# Patient Record
Sex: Female | Born: 1990 | Race: White | Hispanic: Yes | Marital: Married | Smoking: Never smoker
Health system: Western US, Academic
[De-identification: ages and names within clinical notes are randomized; demographics above are authoritative.]

---

## 2017-03-06 ENCOUNTER — Ambulatory Visit (INDEPENDENT_AMBULATORY_CARE_PROVIDER_SITE_OTHER): Payer: TRICARE Prime—HMO | Admitting: Family Medicine

## 2017-03-06 ENCOUNTER — Other Ambulatory Visit: Payer: TRICARE Prime—HMO | Attending: Family Medicine

## 2017-03-06 VITALS — BP 112/72 | HR 89 | Temp 98.3°F | Resp 20 | Ht 66.0 in | Wt 174.2 lb

## 2017-03-06 DIAGNOSIS — Z8639 Personal history of other endocrine, nutritional and metabolic disease: Secondary | ICD-10-CM | POA: Insufficient documentation

## 2017-03-06 DIAGNOSIS — Z3402 Encounter for supervision of normal first pregnancy, second trimester: Secondary | ICD-10-CM

## 2017-03-06 DIAGNOSIS — R5381 Other malaise: Secondary | ICD-10-CM | POA: Insufficient documentation

## 2017-03-06 DIAGNOSIS — Z3A14 14 weeks gestation of pregnancy: Principal | ICD-10-CM

## 2017-03-06 LAB — CBC WITH DIFF, BLOOD
ANC-Automated: 6.8 10*3/uL (ref 1.6–7.0)
Abs Eosinophils: 0.1 10*3/uL (ref 0.1–0.5)
Abs Lymphs: 1.7 10*3/uL (ref 0.8–3.1)
Abs Monos: 0.7 10*3/uL (ref 0.2–0.8)
Eosinophils: 1 %
Hct: 38.1 % (ref 34.0–45.0)
Hgb: 13.4 gm/dL (ref 11.2–15.7)
Lymphocytes: 18 %
MCH: 29.5 pg (ref 26.0–32.0)
MCHC: 35.2 g/dL (ref 32.0–36.0)
MCV: 83.9 um3 (ref 79.0–95.0)
MPV: 12.9 fL — ABNORMAL HIGH (ref 9.4–12.4)
Monocytes: 8 %
Plt Count: 206 10*3/uL (ref 140–370)
RBC: 4.54 10*6/uL (ref 3.90–5.20)
RDW: 13.2 % (ref 12.0–14.0)
Segs: 72 %
WBC: 9.4 10*3/uL (ref 4.0–10.0)

## 2017-03-06 LAB — GLUCOSE, BLOOD: Glucose: 71 mg/dL (ref 70–99)

## 2017-03-06 LAB — HIV 1/2 ANTIBODY & P24 ANTIGEN ASSAY, BLOOD: HIV 1/2 Antibody & P24 Antigen Assay: NONREACTIVE

## 2017-03-06 LAB — ABO/RH: ABO/RH: A POS

## 2017-03-06 LAB — GLYCOSYLATED HGB(A1C), BLOOD: Glyco Hgb (A1C): 5.1 % (ref 4.8–5.8)

## 2017-03-06 LAB — ANTIBODY SCREEN (INDIRECT COOMBS): Antibody Screen: NEGATIVE

## 2017-03-06 LAB — HEPATITIS B SURFACE AG, BLOOD: HBsAg: NONREACTIVE

## 2017-03-06 MED ORDER — PRENATAL VITAMIN PO
ORAL | Status: DC
Start: ? — End: 2018-05-06

## 2017-03-06 NOTE — Patient Instructions (Signed)
1. [redacted] weeks gestation of pregnancy  - Maternal Expanded Screen Test - 2nd Trimester Yellow serum separator tube; Future    2. Other malaise   - HIV 1/2 Antibody & P24 Antigen Assay Yellow serum separator tube; Future  - Glucose, Blood Green Plasma Separator Tube; Future    3. Personal history of other endocrine, nutritional and metabolic disease   - Glycosylated Hgb(A1C), Blood Lavender; Future    4. Encounter for supervision of normal first pregnancy in second trimester  Please have labwork done today   - Quantiferon-TB, Blood Quantiferon Tube Set; Future  - ABO/RH - See Instructions; Future  - Antibody Screen (Indirect Coombs) Lavender; Future  - CBC w/ Diff Lavender; Future  - Hepatitis B Surface Ag, Blood Yellow serum separator tube; Future  - Syphilis Screen, Blood Yellow serum separator tube; Future  - HIV 1/2 Antibody & P24 Antigen Assay Yellow serum separator tube; Future  - Rubella IgG Antibody, Blood Yellow serum separator tube; Future  - Varicella IgG Antibody, Blood Yellow serum separator tube; Future  - Urinalysis; Future  - Cystic Fibrosis, 39 Mutations, Blood Lavender; Future  - Glycosylated Hgb(A1C), Blood Lavender; Future  - Glucose, Blood Green Plasma Separator Tube; Future  - US 2nd/3rd Trimester OB>14 Weeks Anatomy Scan Low Risk; Future

## 2017-03-06 NOTE — Interdisciplinary (Signed)
Blood drawn from right hand with 23 gauge needle. 10 tubes taken.   Patient identity authenticated by Elvira Polanco.

## 2017-03-06 NOTE — Progress Notes (Signed)
Est care  Referral to OB [redacted] weeks pregnant

## 2017-03-06 NOTE — Progress Notes (Signed)
[facility]: Office Visit    SUBJECTIVE:  Belinda Watson is a 26 year old female with chief complaint of: New Patient and Initial Prenatal Visit    26yo G1P0 presenting to establish care for pregnancy. She is interested in seeing an obstetrical provider that will follow her through her pregnancy. Went yesterday for a "hi mom" scan in [place] and was told the gender of the baby. The heart beat was 150 during the scan and the baby was moving. There is no formal report for the scan.     Unsure of her weight prior to pregnancy but thinks she was 170lbs.     No symptoms. Has been having nausea all the time. Some sharp pain on her side.     LMP was 11/28/16   EDD 09/04/2017 based on sure LMP     Preventive Screenings/HM:     ROS: per HPI     OBJECTIVE:  The patient is a WDWN female in NAD, non-toxic appearance  BP 112/72 (BP Location: Right arm, BP Patient Position: Sitting, BP cuff size: Regular)   Pulse 89   Temp 98.3 F (36.8 C) (Oral)   Resp 20   Ht 5\' 6"  (1.676 m)   Wt 79 kg (174 lb 2.6 oz)   SpO2 98%   BMI 28.11 kg/m2    FHR: 168  Fundal height 15cm.     Assessment and Plan:     See Patient instructions below for detailed plan.   Lavona MoundGloria Cristina Klump is a 26 year old female with:    Patient Instructions   1. [redacted] weeks gestation of pregnancy  - Maternal Expanded Screen Test - 2nd Trimester Yellow serum separator tube; Future    2. Other malaise   - HIV 1/2 Antibody & P24 Antigen Assay Yellow serum separator tube; Future  - Glucose, Blood Green Plasma Separator Tube; Future    3. Personal history of other endocrine, nutritional and metabolic disease   - Glycosylated Hgb(A1C), Blood Lavender; Future    4. Encounter for supervision of normal first pregnancy in second trimester  Please have labwork done today   - Quantiferon-TB, Blood Quantiferon Tube Set; Future  - ABO/RH - See Instructions; Future  - Antibody Screen (Indirect Coombs) Lavender; Future  - CBC w/ Diff Lavender; Future  - Hepatitis B  Surface Ag, Blood Yellow serum separator tube; Future  - Syphilis Screen, Blood Yellow serum separator tube; Future  - HIV 1/2 Antibody & P24 Antigen Assay Yellow serum separator tube; Future  - Rubella IgG Antibody, Blood Yellow serum separator tube; Future  - Varicella IgG Antibody, Blood Yellow serum separator tube; Future  - Urinalysis; Future  - Cystic Fibrosis, 39 Mutations, Blood Lavender; Future  - Glycosylated Hgb(A1C), Blood Lavender; Future  - Glucose, Blood Green Plasma Separator Tube; Future  - US 2nd/3rd Trimester OB>14 Weeks Anatomy Scan Low Risk; Future          Return in 4 weeks (on 04/03/2017) for OB visit - schedule with Dr. Billee Cashingelebi to establish for obstetrical care in the next  1 week .    No past medical history on file.  No current outpatient prescriptions on file prior to visit.     No current facility-administered medications on file prior to visit.      Allergies as of 03/06/2017    (No Known Allergies)     No family history on file.  History   Smoking Status    Not on file  Smokeless Tobacco    Not on file       There is no immunization history on file for this patient.      Patient Instruction:   Medications reviewed with patient and medication list reconciled.   Over the counter medications, herbal therapies and supplements reviewed.   Patient's understanding and response to medications assessed.   Barriers to medications assessed and addressed.   Risks, benefits, alternatives to medications reviewed.   Barriers to Learning assessed: none. Patient verbalizes understanding of teaching and instructions.    Nolon Bussingeepa Paizley Ramella MD  Division of Family Medicine  [place] [facility]

## 2017-03-07 ENCOUNTER — Encounter (INDEPENDENT_AMBULATORY_CARE_PROVIDER_SITE_OTHER): Payer: Self-pay | Admitting: Family Medicine

## 2017-03-07 DIAGNOSIS — R8290 Unspecified abnormal findings in urine: Principal | ICD-10-CM

## 2017-03-07 LAB — SYPHILIS EIA SCREEN, BLOOD: Syphilis EIA Screen: NEGATIVE

## 2017-03-07 LAB — URINALYSIS
Bilirubin: NEGATIVE
Blood: NEGATIVE
Glucose: NEGATIVE
Ketones: NEGATIVE
Leuk Esterase: NEGATIVE
Nitrite: NEGATIVE
Specific Gravity: 1.024 (ref 1.002–1.030)
Urobilinogen: NEGATIVE
pH: 6 (ref 5.0–8.0)

## 2017-03-07 LAB — RUBELLA IGG ANTIBODY, BLOOD: Rubella IGG Ab (EIA): POSITIVE

## 2017-03-08 ENCOUNTER — Telehealth (INDEPENDENT_AMBULATORY_CARE_PROVIDER_SITE_OTHER): Payer: Self-pay | Admitting: Family Medicine

## 2017-03-08 LAB — VARICELLA IGG ANTIBODY (ELISA), BLOOD: Varicella Zoster IgG Ab: NEGATIVE mL — AB

## 2017-03-08 LAB — QUANTIFERON-TB, BLOOD
Quantiferon TB: NEGATIVE
TB Antigen - Nil: -0.007 [IU]/mL

## 2017-03-08 NOTE — Telephone Encounter (Signed)
Pt called to follow up on her request for a callback to discuss her lab result.     Patient states she did not understand the result in MyChart.     Please advise.

## 2017-03-08 NOTE — Telephone Encounter (Signed)
Pt is calling to get lab results.    Pt stated she did not understand the results that were to sent to her via mychart.     Please assist.

## 2017-03-09 LAB — CYSTIC FIBROSIS, 39 MUTATIONS, BLOOD

## 2017-03-09 NOTE — Telephone Encounter (Signed)
Sent mychart to follow up.

## 2017-03-12 ENCOUNTER — Encounter (HOSPITAL_BASED_OUTPATIENT_CLINIC_OR_DEPARTMENT_OTHER): Payer: Self-pay | Admitting: Gynecology

## 2017-03-14 ENCOUNTER — Encounter (INDEPENDENT_AMBULATORY_CARE_PROVIDER_SITE_OTHER): Payer: Self-pay | Admitting: Family Medicine

## 2017-03-14 ENCOUNTER — Ambulatory Visit (INDEPENDENT_AMBULATORY_CARE_PROVIDER_SITE_OTHER): Payer: TRICARE Prime—HMO | Admitting: Family Medicine

## 2017-03-14 ENCOUNTER — Other Ambulatory Visit: Payer: TRICARE Prime—HMO | Attending: Family Medicine

## 2017-03-14 VITALS — BP 98/61 | HR 84 | Temp 97.0°F | Resp 17 | Ht 66.0 in | Wt 199.5 lb

## 2017-03-14 DIAGNOSIS — R8290 Unspecified abnormal findings in urine: Secondary | ICD-10-CM | POA: Insufficient documentation

## 2017-03-14 DIAGNOSIS — Z3402 Encounter for supervision of normal first pregnancy, second trimester: Principal | ICD-10-CM

## 2017-03-14 LAB — URINALYSIS WITH CULTURE REFLEX, WHEN INDICATED
Bilirubin: NEGATIVE
Blood: NEGATIVE
Glucose: NEGATIVE
Ketones: NEGATIVE
Leuk Esterase: NEGATIVE
Nitrite: NEGATIVE
Protein: NEGATIVE
Specific Gravity: 1.013 (ref 1.002–1.030)
Urobilinogen: NEGATIVE
pH: 7 (ref 5.0–8.0)

## 2017-03-14 NOTE — Progress Notes (Signed)
Patient is here for amenorrhea and to confirm pregnancy.   Her LMP was 11/28/16. Her cycles were regular.     First official OB/GYN visit today. This is her first pregnancy. She states that she feels nauseous with no vomiting currently, but did have nausea with vomiting last week. Has troubles drinking water due to nausea. Has a history of bladder infections. History of tobacco use, but has not consumed during pregnancy.    Prior to pregnancy, patient reports being 180 lbs.    Patient's father side has history with arthritis and patient's father has high cholesterol. Patient was premature due to tangled umbilical cord. Father of baby has history of cancer. Maternal mother and grandmother has history of diabetes.       Allergies: Pollen      Past medical history:   Diabetes Y ___ N _X_   Heart Disease Y ___ N _X__   Auto-immune disorder Y ___ N _X__   Kidney Disease-UTI Y ___ N _X__   Neurologic/Epilepsy Y ___ N __X_   Psychiatric Y ___ N _X_   Depression/PP Depression Y ___ N _X__   Hepatitis/Liver Disease Y ___ N _X__   Varicosities/Phlebitis Y ___ N __X_   Thyroid Dysfunction Y ___ N _X__   Trauma/Violence Y ___ N _X__   History of Blood Transfusion Y ___ N _X__   Tobacco Y ___ N _X__   Alcohol Y ___ N __X_   Illicit/Recreational Drugs Y ___ N __X_   D (Rh) Sensitized Y ___ N _X__   Pulmonary (TB, Asthma) Y ___ N __X__  Seasonal Allergies Y ___ N _X__   Drug/Latex Allergies Y ___ N _X__   Breast Surgery Y ___ N __X_   Gyn Surgery Y ___ N _X__   Operations/Hopsitalizations Y ___ N __X_  Anesthetic Complications Y ___ N _X__   History of Abnormal PAP Y ___ N __X_   Uterine Anomalies Y ___ N _X__   Infertility Y ___ N _X__       Genetic Screening (includes patient, baby's father or anyone in either family with):   Patient's age 78 years or older as of the estimated date of delivery NO   Thalassemia (IItalian, [place], Mediteranian or Asian background) NO   Congenital heart defect NO   Down's Syndrome NO   Tay-sachs  (Ashkenazi Jewish, [place], [place]) NO   Canavan Disease (Ashkenazi Jewish) NO   Familial Dysautonomia (Ashkenazi Jewish) NO   Sickle-cell disease or trait NO   Hemophilia or other blood disorders NO   Muscular Dystrophy NO  Cystic Fibrosis NO   Huntingtons Chorea NO   Mental Retardation/Autism NO  Other Inherited genetic or chromosomal disorders NO   Patient or baby's father had a previous child with birth defect? NO   Recurrent pregnancy lost or still birth? NO   Medications including OTC since LMP:  none    Infection History   Live with someone with TB or exposed to TB NO   Patient or partner has history of genital herpes NO   Rash or viral illness since LMP NO   Hepititis B NO   Hepititis C NO   History of STD NO  Gonorrihea NO   Chlamydia NO   HPV NO   HIV NO   Syphillis NO    Labs:  Recent Results (from the past 1008 hour(s))   Urinalysis    Collection Time: 03/06/17  3:30 PM   Result Value Ref Range  Type Clean catch     Color Yellow Yellow    Appearance Hazy Clear    Specific Gravity 1.024 1.002 - 1.030    pH 6.0 5.0 - 8.0    Protein 1+ (A) Negative    Glucose Negative Negative    Ketones Negative Negative    Bilirubin Negative Negative    Blood Negative Negative    Urobilinogen Negative Negative    Nitrite Negative Negative    Leuk Esterase Negative Negative    WBC 0-2 0-2/HPF    RBC 3-5 (A) 0-2/HPF    Bacteria Few (A) None-Rare/HPF    Squam. Epithelial Cell 0-5(RARE) <6-10(FEW)    Mucus Rare None-Rare/HPF    CAOX Crystal Rare None-Rare/HPF   ABO/RH - See Instructions    Collection Time: 03/06/17  3:48 PM   Result Value Ref Range    ABO/RH A POS    Antibody Screen (Indirect Coombs) Lavender    Collection Time: 03/06/17  3:48 PM   Result Value Ref Range    Antibody Screen NEG    Quantiferon-TB, Blood Quantiferon Tube Set    Collection Time: 03/06/17  3:48 PM   Result Value Ref Range    Quantiferon TB Negative See Comment    TB Antigen - Nil -0.007 [IU]/mL   CBC w/ Diff Lavender    Collection Time:  03/06/17  3:48 PM   Result Value Ref Range    WBC 9.4 4.0 - 10.0 1000/mm3    RBC 4.54 3.90 - 5.20 mill/mm3    Hgb 13.4 11.2 - 15.7 gm/dL    Hct 38.1 34.0 - 45.0 %    MCV 83.9 79.0 - 95.0 um3    MCH 29.5 26.0 - 32.0 pgm    MCHC 35.2 32.0 - 36.0 g/dL    RDW 13.2 12.0 - 14.0 %    MPV 12.9 (H) 9.4 - 12.4 fL    Plt Count 206 140 - 370 1000/mm3    Segs 72 %    Lymphocytes 18 %    Monocytes 8 %    Eosinophils 1 %    ANC-Automated 6.8 1.6 - 7.0 1000/mm3    Abs Lymphs 1.7 0.8 - 3.1 1000/mm3    Abs Monos 0.7 0.2 - 0.8 1000/mm3    Abs Eosinophils 0.1 <0.1 - 0.5 1000/mm3    Diff Type Automated    Hepatitis B Surface Ag, Blood Yellow serum separator tube    Collection Time: 03/06/17  3:48 PM   Result Value Ref Range    HBsAg Non Reactive    Syphilis Screen, Blood Yellow serum separator tube    Collection Time: 03/06/17  3:48 PM   Result Value Ref Range    Syphilis EIA Screen Negative See Comment   HIV 1/2 Antibody & P24 Antigen Assay Yellow serum separator tube    Collection Time: 03/06/17  3:48 PM   Result Value Ref Range    HIV 1/2 Antibody & P24 Antigen Assay Non Reactive    Rubella IgG Antibody, Blood Yellow serum separator tube    Collection Time: 03/06/17  3:48 PM   Result Value Ref Range    Rubella IGG Ab (EIA) POSITIVE See Comment   Varicella IgG Antibody, Blood Yellow serum separator tube    Collection Time: 03/06/17  3:48 PM   Result Value Ref Range    Varicella Zoster IgG Ab Negative (A) See Comment mL   Cystic Fibrosis, 39 Mutations, Blood Lavender    Collection Time: 03/06/17  3:48 PM  Result Value Ref Range    CF Source Peripheral Blood     CF Allele 1 Wild-type     CF Allele 2 Wild-type     CF Interp Comments       Negative for the mutations analyzed. With no family history, the residual   carrier risk for an individual after a negative test is:    Ethnic Group       Carrier Risk    Detection Rate    Risk After a Neg. Test  Ashkenazi Jewish   1 in 9               94.0%              1 in 103    Caucasian           1 in 27               90.5%              1 in 294  (Non Hispanic)    African American   1 in 89               67.5%              1 in 190    Hispanic American  1 in 35               73.8%              1 in 38    Asian American     1 in 90               48.9%              1 in 176        CF Report       Background  Cystic fibrosis (CF) is an autosomal recessive disorder resulting from   mutations within the cystic fibrosis transmembrane conductance regulator   (CFTR) gene. Manifestations of the disease include chronic obstructive lung   disease and pancreatic enzyme insufficiency. Greater than 1,500 causative   variants have been described within the CFTR gene. The most common variant,   F508del, accounts for approximately 67% of the mutations worldwide and   approximately 75% in the [place] population. Most of the   remaining mutations are rare, with some having a higher prevalence in certain   ethnic groups. Variants detected by this assay include the 23 mutations   currently recommended by the [place] and [employer] of [employer]stetricians and Gynecologists (ACMG/ACOG) as well as 16 other   mutations as listed below:    G85E (c.254G>A)  R117H (c.350G>A)  R334W (c.1000C>T)  R347P (c.1040G>C)  R347H (c.1040G>A)  394delTT (c.262_263delTT)   A455E (c.1364C>A)  I507del (c.1519_1521delATC)  F508del (c.1521_1523delCTT)  V520F (c.1558G>T)  G542X (c.1624G>T)  S549N (c.1646G>A)  S549R (c.1645A>C or 1647T>G)  G551D (c.1652G>A)  R553X (c.1657C>T)  R560T (c.1679G>C)  621+1G>T (c.489+1G>T)  711+1G>T (c.579+1G>T)  1078delT (c.948delT)  R1162X (c.3484C>T)  W1282X (c.3846G>A)  N1303K (c.3909C>G)  1717-1G>A (c.1585-1G>A)  1898+1G>A (c.1766+1G>A)  2183AA>G (c.2051_2052delAAinsG)  2184delA (c.2052delA)  2789+5G>A (c.2657+5G>A)  3120+1G>A (c.2988+1G>A)  3659delC (c.3528delC)  3849+10kbC>T (c.3717+12191C>T)  3876delA (c.3744delA)  3905insT (c.3773_3774insT)  Y122X (c.366T>A)  A559T  (c.1675G>A)  1898+5G>T (c.1766+5G>T)  2307insA (c.2175_2176insA)  Y1092X (c.3276C>A, c.3276C>G)  M1101K (c.3302T>A)  S1255X (c.3764C>A)          For specimens positive for R117H, the  IVS-8/poly T (c.1210-12T[5_9]is   analyzed. Reflex testing for the I506V, I507V and F508C polymorphisms is   performed when unexpected homozygosity is observed for either the delta F508 or   deltaI507 mutations.  The mutations tested are listed above according to the   legacy nomenclature; the standard nomenclature is listed in parentheses. Panel   mutations are reported according to the legacy nomenclature.    Methodology  The test is performed using the Luminex xTAG Cystic Fibrosis 39 Kit v2. The   assay incorporates multiplex polymerase chain reaction (PCR) and multiplex   allele specific primer extension (ASPE) with a proprietary universal tag   sorting system on the Luminex analyzer.    Limitations  The xTAG Cystic Fibrosis (CFTR) 39 kit v2 is a qualitative genotyping test   which provides information intended to be used for carrier testing in adults   of reproductive age, as an aid in newborn screening, and in confirmatory   diagnostic testing in newborns and children. Rare diagnostic errors can occur   due to primer or probe mutations. Only the 39 CFTR mutations listed above will   be interrogated.    Compliance  This test was adopted and its performance  characteristics determined by the [facility]. It has not been   cleared or approved by the Food and Drug Administration (FDA). FDA clearance   or approval is not required for clinical use. The result of this test should   be interpreted using all relevant clinical data and should not be used alone   for clinical diagnosis or patient management decisions.    References  1. Watson MS et al. (2004) Cystic fibrosis population carrier screening: 2004   revision of [employer] of Medical Genetics mutation panel. Gen in Med      6:397-91.  2. Lorel Monaco et al. (2006) Technical standards and guidelines for CFTR mutation   testing. [employer]CMG [employer].   3. Luminex Kit Package Insert, IVD xTAG Cystic Fibrosis 39 Kit v2,   [id number] Rev K, effective date: October 2015      Electronic Signature       Chauncy Lean, M.D., Ph.D., Medical Director, [facility]    Date/Time 03/09/2017 1:19 PM    Glycosylated Hgb(A1C), Blood Lavender    Collection Time: 03/06/17  3:48 PM   Result Value Ref Range    Glyco Hgb (A1C) 5.1 4.8 - 5.8 %   Glucose, Blood Green Plasma Separator Tube    Collection Time: 03/06/17  3:48 PM   Result Value Ref Range    Glucose 71 70 - 99 mg/dL         Objective:   03/14/17  0846   BP: 98/61   Pulse: 84   Resp: 17   Temp: 97 F (36.1 C)   SpO2: 99%       General Appearance: healthy and alert  Lungs: clear to auscultation; breath sounds normal; no respiratory distress  Heart: normal rate and rhythm and normal S1, S2, no murmurs  Abdomen: Fundal Height: 15.5 cm. FHR: 140  Ext: no edema    A/P: 26yo G1P0 at 29w1dby LMP    1. Encounter for supervision of normal first pregnancy in second trimester  - Urine Culture - See Instructions; Future  - Chlamydia/GC PCR, Urine Roche PCR Collection SystemTube; Future  - Spinal Muscular Atrophy (SMA) Copy Number Analysis, Blood Lavender; Future  - [place]3rd Tr[place] OB>14 Weeks Anatomy Scan Low  Risk; Future  - Family Medicine Clinic    2. Abnormal finding in urine   - Urine Culture - See Instructions; Future    Patient Instructions   Thank you for visiting [place] today. Here are some guidelines and notes from our conversations during your visit.    1. Make sure to stay hydrated. Avoid tea and coffee based drinks as they may cause more frequent urination. Continue to take your pre-natal supplements and make sure they have some iron and at least 161WRU folic acid.    2. Please get your labs done after this visit.    3. I have made a referral for [place] to try to make your  scheduling easier. You can go ahead and schedule your routine anatomy scan for your 18 week mark.    4. If you need to contact me, feel free to contact me through MyChart for non-urgent matters or call the clinic for urgent matters.    5. Expected weight gain during pregnancy is about 25 lbs and you are currently at a 20 lbs increase from your pre-pregnancy weight, so let's keep an eye on your weight.    Thank you,    Dr. Lady Deutscher        I am personally taking down the notes in the presence of Dr. Laurena Bering, MD. -- Gene He, 03/14/2017 8:59 AM    I, Lady Deutscher, personally performed the services in this documentation, as scribed by [facility] in my presence and I attest to the accuracy and completion of the documentation.  Laurena Bering, MD

## 2017-03-14 NOTE — Addendum Note (Signed)
Addended by: Sonia SideMEADOWS, ESPERANZA MEDINA on: 03/14/2017 10:11 AM     Modules accepted: Orders

## 2017-03-14 NOTE — Interdisciplinary (Signed)
Blood drawn from right arm with 23 gauge needle. 1 tubes taken.   Patient identity authenticated by Esperanza Medina Meadows.

## 2017-03-14 NOTE — Patient Instructions (Addendum)
Thank you for visiting us today. Here are some guidelines and notes from our conversations during your visit.    1. Make sure to stay hydrated. Avoid tea and coffee based drinks as they may cause more frequent urination. Continue to take your pre-natal supplements and make sure they have some iron and at least 400mcg folic acid.    2. Please get your labs done after this visit.    3. I have made a referral for [place] to try to make your scheduling easier. You can go ahead and schedule your routine anatomy scan for your 18 week mark.    4. If you need to contact me, feel free to contact me through MyChart for non-urgent matters or call the clinic for urgent matters.    5. Expected weight gain during pregnancy is about 25 lbs and you are currently at a 20 lbs increase from your pre-pregnancy weight, so let's keep an eye on your weight.    Thank you,    Dr. Delice LeschJulie Celebi

## 2017-03-15 ENCOUNTER — Encounter: Payer: Self-pay | Admitting: Hospital

## 2017-03-16 LAB — CHLAMYDIA/GONORRHEA PCR, URINE
Chlamydia trachomatis PCR, Urine: NOT DETECTED
Neisseria gonorrhoeae PCR, Urine: NOT DETECTED

## 2017-03-22 ENCOUNTER — Encounter (INDEPENDENT_AMBULATORY_CARE_PROVIDER_SITE_OTHER): Payer: Self-pay | Admitting: Family Medicine

## 2017-03-22 LAB — SPINAL MUSCULAR ATROPHY (SMA) COPY NUMBER ANALYSIS
SMA Copy Number, SMN1 Copies: 2
SMA Copy Number, SMN2 Copies: 0

## 2017-04-12 ENCOUNTER — Encounter (INDEPENDENT_AMBULATORY_CARE_PROVIDER_SITE_OTHER): Payer: TRICARE Prime—HMO | Admitting: Family Medicine

## 2017-06-06 ENCOUNTER — Encounter (INDEPENDENT_AMBULATORY_CARE_PROVIDER_SITE_OTHER): Payer: Self-pay | Admitting: Internal Medicine

## 2017-06-06 DIAGNOSIS — Z0000 Encounter for general adult medical examination without abnormal findings: Principal | ICD-10-CM

## 2017-12-03 ENCOUNTER — Encounter (HOSPITAL_COMMUNITY): Payer: Self-pay

## 2017-12-14 ENCOUNTER — Encounter (INDEPENDENT_AMBULATORY_CARE_PROVIDER_SITE_OTHER): Payer: TRICARE Prime—HMO | Admitting: Family Medicine

## 2018-01-31 ENCOUNTER — Other Ambulatory Visit: Payer: TRICARE Prime—HMO | Attending: Family Medicine | Admitting: Family Medicine

## 2018-01-31 VITALS — BP 106/74 | HR 83 | Temp 97.5°F | Resp 20 | Ht 66.0 in | Wt 206.0 lb

## 2018-01-31 DIAGNOSIS — N760 Acute vaginitis: Secondary | ICD-10-CM | POA: Insufficient documentation

## 2018-01-31 DIAGNOSIS — Z1389 Encounter for screening for other disorder: Secondary | ICD-10-CM

## 2018-01-31 DIAGNOSIS — Z723 Lack of physical exercise: Secondary | ICD-10-CM

## 2018-01-31 DIAGNOSIS — Z30011 Encounter for initial prescription of contraceptive pills: Secondary | ICD-10-CM

## 2018-01-31 DIAGNOSIS — Z309 Encounter for contraceptive management, unspecified: Secondary | ICD-10-CM

## 2018-01-31 DIAGNOSIS — Z1339 Encounter for screening examination for other mental health and behavioral disorders: Secondary | ICD-10-CM

## 2018-01-31 DIAGNOSIS — Z124 Encounter for screening for malignant neoplasm of cervix: Secondary | ICD-10-CM | POA: Insufficient documentation

## 2018-01-31 DIAGNOSIS — Z1331 Encounter for screening for depression: Secondary | ICD-10-CM

## 2018-01-31 LAB — VAGINOSIS SCREEN
Candida, Vaginal Screen: NEGATIVE
Gardnerella, Vaginal Screen: POSITIVE — AB
Trichomonas, Vaginal Screen: NEGATIVE

## 2018-01-31 MED ORDER — NORETHINDRONE (CONTRACEPTIVE) 0.35 MG OR TABS
1.0000 | ORAL_TABLET | Freq: Every day | ORAL | 11 refills | Status: DC
Start: 2018-01-31 — End: 2018-04-04

## 2018-01-31 NOTE — Patient Instructions (Addendum)
Start minipill for birth control-- use condoms as back-up for 48 hours     We will let you know when Mirena IUD gets approved.    Intrauterine Device Insertion  An intrauterine device (IUD) is a medical device that gets inserted into the uterus to prevent pregnancy. It is a small, T-shaped device that has one or two nylon strings hanging down from it. The strings hang out of the lower part of the uterus (cervix) to allow for future IUD removal. There are two types of IUDs available:   Copper IUD. This type of IUD has copper wire wrapped around it. Copper makes the uterus and fallopian tubes produce a fluid that kills sperm. A copper IUD may last up to 10 years.   Hormone IUD. This type of IUD is made of plastic and contains the hormone progestin (synthetic progesterone). The hormone thickens mucus in the cervix and prevents sperm from entering the uterus. It also thins the uterine lining to prevent implantation of a fertilized egg. The hormone can weaken or kill the sperm that get into the uterus. A hormone IUD may last 3-5 years.    Tell a health care provider about:   Any allergies you have.   All medicines you are taking, including vitamins, herbs, eye drops, creams, and over-the-counter medicines.   Any problems you or family members have had with anesthetic medicines.   Any blood disorders you have.   Any surgeries you have had.   Any medical conditions you have, including any STIs (sexually transmitted infections) you may have.   Whether you are pregnant or may be pregnant.  What are the risks?  Generally, this is a safe procedure. However, problems may occur, including:   Infection.   Bleeding.   Allergic reactions to medicines.   Accidental puncture (perforation) of the uterus, or damage to other structures or organs.   Accidental placement of the IUD either in the muscle layer of the uterus (myometrium) or outside the uterus.   The IUD falling out of the uterus (expulsion). This is more  common among women who have recently had a child.   Pregnancy that happens in the fallopian tube (ectopic pregnancy).   Infection of the uterus and fallopian tubes (pelvic inflammatory disease).    What happens before the procedure?   Schedule the IUD insertion for when you will have your menstrual period or right after, to make sure you are not pregnant. Placement of the IUD is better tolerated shortly after a menstrual cycle.   Follow instructions from your health care provider about eating or drinking restrictions.   Ask your health care provider about changing or stopping your regular medicines. This is especially important if you are taking diabetes medicines or blood thinners.   You may get a pain reliever to take before the procedure.   You may have tests for:  ? Pregnancy. A pregnancy test involves having a urine sample taken.  ? STIs. Placing an IUD in someone who has an STI can make the infection worse.  ? Cervical cancer. You may have a Pap test to check for this type of cancer. This means collecting cells from your cervix to be examined under a microscope.   You may have a physical exam to determine the size and position of your uterus.  The procedure may vary among health care providers and hospitals.  What happens during the procedure?   A tool (speculum) will be placed in your vagina and widened so  that your health care provider can see your cervix.   Medicine may be applied to your cervix to help lower your risk of infection (antiseptic medicine).   You may be given an anesthetic medicine to numb each side of your cervix (intracervical block or paracervical block). This medicine is usually given by an injection into the cervix.   A tool (uterine sound) will be inserted into your uterus to determine the length of your uterus and the direction that your uterus may be tilted.   A slim instrument or tube (IUD inserter) that holds the IUD will be inserted into your vagina, through your  cervical canal, and into your uterus.   The IUD will be placed in the uterus, and the IUD inserter will be removed.   The strings that are attached to the IUD will be trimmed so that they lie just below the cervix.  The procedure may vary among health care providers and hospitals.  What happens after the procedure?   You may have bleeding after the procedure. This is normal. It varies from light bleeding (spotting) for a few days to menstrual-like bleeding.   You may have cramping and pain.   You may feel dizzy or light-headed.   You may have lower back pain.  Summary   An intrauterine device (IUD) is a small, T-shaped device that has one or two nylon strings hanging down from it.   Two types of IUDs are available. You may have a copper IUD or a hormone IUD.   Schedule the IUD insertion for when you will have your menstrual period or right after, to make sure you are not pregnant. Placement of the IUD is better tolerated shortly after a menstrual cycle.   You may have bleeding after the procedure. This is normal. It varies from light spotting for a few days to menstrual-like bleeding.  This information is not intended to replace advice given to you by your health care provider. Make sure you discuss any questions you have with your health care provider.  Document Released: 03/22/2011 Document Revised: 06/14/2016 Document Reviewed: 06/14/2016  Elsevier Interactive Patient Education  2017 [employer] Inc[employer]    Eat something light before your appointment.  Don't urinate just before arrival, because you will give a urine sample for a pregnancy test.  To manage any pain, take an over the counter medication for cramping like:  Ibuprofen (Advil) Take 3-4 of 200 mg tablets 30 minutes prior to the appointment  Naproxen (Aleve) Take 2 of the 220 mg tablets 30 minutes prior to the appointment  1000 mg of Tylenol (acetaminophen).    *If you are allergic to ibuprofen, Tylenol or naproxen or should be avoiding these  kinds of medications, consult with a provider.    If you have any questions or concerns, please feel free to give us a call at [phone].

## 2018-01-31 NOTE — Progress Notes (Signed)
[facility] Progress Note    CC:   Chief Complaint   Patient presents with    Gyn Exam         Subjective: Belinda Watson is a 27 year old female who is here for CC above.      Had term SVD 5 months ago, went well, did have a lac. No issues with intercourse  Never had 6wk postpartum visit, just 2wk after delivery  Wondering about being cleared for exercise, doing some higher intensity walking  About 40 lb wt gain in pregnancy  Breastfeeding going well  Started back with menses in February, monthly  Using condoms consistently, no other hormonal methods, worried bout weight gain  Would like to get pregnant in about 42yr  Mood has been OK overall, does sometimes get emotional but seems to correlate with menses  Needs pap, no hx of abnormal, last pap at least a couple years ago.  Did end up getting varicella vaccine in hospital postpartum. She believes she got Tdap as well  Notes fishy vaginal odor with some discharge. Also occasional lower pelvic pains. No concern for STIs, just one female partner    Wt Readings from Last 5 Encounters:   01/31/18 93.4 kg (206 lb)   03/14/17 90.5 kg (199 lb 8.3 oz)   03/06/17 79 kg (174 lb 2.6 oz)         ROS: as per HPI    PMH reviewed/updated.    There is no problem list on file for this patient.      Medications reviewed with patient and medication list reconciled.   Patient's understanding and response to medications assessed.     Outpatient Medications Prior to Visit   Medication Sig Dispense Refill    Prenatal Vit-Fe Fumarate-FA (PRENATAL VITAMIN PO)        No facility-administered medications prior to visit.          There is no immunization history on file for this patient.  No Known Allergies    Objective:  Vitals:    01/31/18 1024   BP: 106/74   BP Location: Right arm   BP Patient Position: Sitting   BP cuff size: Large   Pulse: 83   Resp: 20   Temp: 97.5 F (36.4 C)   TempSrc: Oral   SpO2: 99%   Weight: 93.4 kg (206 lb)   Height: 5\' 6"  (1.676 m)      Estimated body mass index is 32.2 kg/m as calculated from the following:    Height as of 03/14/17: 5\' 6"  (1.676 m).    Weight as of 03/14/17: 90.5 kg (199 lb 8.3 oz).      General Appearance: healthy, alert, no distress, pleasant affect, cooperative.  Eyes:  conjunctivae and corneas clear. PERRL, EOM's intact.   Ears:  normal TMs and canal.  Mouth: OP clear, dentition in good repair.  Neck:  Neck supple. No adenopathy, thyroid symmetric, normal size.  Heart:  normal rate and regular rhythm, no murmurs, clicks, or gallops.  Lungs: clear to auscultation, respirations unlabored, no chest deformities noted.  Breast:  inspection negative. No nipple discharge, bleeding or masses.  Pelvic:  External genitalia and vagina normal, thin white vaginal discharge present, cervix with Nabothian cysts but otherwise clear, normal sized uterus, adnexae negative.      Assessment/Plan: Belinda Watson is a 27 year old female with the following issues addressed:    1. Cervical cancer screening  - Cytopath Gyn (Pap  Smear)    2. Vaginosis  Suspect BV, management pending results  - Vaginosis Screen BD Affirm Collection System    3. Encounter for contraceptive management, unspecified type  Discussed various methods, Pt interested in IUD. Will use minipill in the meantime - counseled on need for barrier method for first 48h after starting, also the need to take at the same time every day.  - DME Misc Order: Mirena IUD  - norethindrone (ORTHO MICRONOR) 0.35 MG tablet; Take 1 tablet by mouth daily.  Dispense: 28 tablet; Refill: 4  - [facility]    4. Screened negative for drug use    5. Inadequate exercise  Cleared for physical activity, start low-impact and advance as tolerated.    6. Screened negative for alcohol use    7. Screened negative for depression          Patient Instructions   Start minipill for birth control-- use condoms as back-up for 48 hours     We will let you know when Mirena IUD gets  approved.    Intrauterine Device Insertion  An intrauterine device (IUD) is a medical device that gets inserted into the uterus to prevent pregnancy. It is a small, T-shaped device that has one or two nylon strings hanging down from it. The strings hang out of the lower part of the uterus (cervix) to allow for future IUD removal. There are two types of IUDs available:   Copper IUD. This type of IUD has copper wire wrapped around it. Copper makes the uterus and fallopian tubes produce a fluid that kills sperm. A copper IUD may last up to 10 years.   Hormone IUD. This type of IUD is made of plastic and contains the hormone progestin (synthetic progesterone). The hormone thickens mucus in the cervix and prevents sperm from entering the uterus. It also thins the uterine lining to prevent implantation of a fertilized egg. The hormone can weaken or kill the sperm that get into the uterus. A hormone IUD may last 3-5 years.    Tell a health care provider about:   Any allergies you have.   All medicines you are taking, including vitamins, herbs, eye drops, creams, and over-the-counter medicines.   Any problems you or family members have had with anesthetic medicines.   Any blood disorders you have.   Any surgeries you have had.   Any medical conditions you have, including any STIs (sexually transmitted infections) you may have.   Whether you are pregnant or may be pregnant.  What are the risks?  Generally, this is a safe procedure. However, problems may occur, including:   Infection.   Bleeding.   Allergic reactions to medicines.   Accidental puncture (perforation) of the uterus, or damage to other structures or organs.   Accidental placement of the IUD either in the muscle layer of the uterus (myometrium) or outside the uterus.   The IUD falling out of the uterus (expulsion). This is more common among women who have recently had a child.   Pregnancy that happens in the fallopian tube (ectopic  pregnancy).   Infection of the uterus and fallopian tubes (pelvic inflammatory disease).    What happens before the procedure?   Schedule the IUD insertion for when you will have your menstrual period or right after, to make sure you are not pregnant. Placement of the IUD is better tolerated shortly after a menstrual cycle.   Follow instructions from your health care provider about eating or  drinking restrictions.   Ask your health care provider about changing or stopping your regular medicines. This is especially important if you are taking diabetes medicines or blood thinners.   You may get a pain reliever to take before the procedure.   You may have tests for:  ? Pregnancy. A pregnancy test involves having a urine sample taken.  ? STIs. Placing an IUD in someone who has an STI can make the infection worse.  ? Cervical cancer. You may have a Pap test to check for this type of cancer. This means collecting cells from your cervix to be examined under a microscope.   You may have a physical exam to determine the size and position of your uterus.  The procedure may vary among health care providers and hospitals.  What happens during the procedure?   A tool (speculum) will be placed in your vagina and widened so that your health care provider can see your cervix.   Medicine may be applied to your cervix to help lower your risk of infection (antiseptic medicine).   You may be given an anesthetic medicine to numb each side of your cervix (intracervical block or paracervical block). This medicine is usually given by an injection into the cervix.   A tool (uterine sound) will be inserted into your uterus to determine the length of your uterus and the direction that your uterus may be tilted.   A slim instrument or tube (IUD inserter) that holds the IUD will be inserted into your vagina, through your cervical canal, and into your uterus.   The IUD will be placed in the uterus, and the IUD inserter will be  removed.   The strings that are attached to the IUD will be trimmed so that they lie just below the cervix.  The procedure may vary among health care providers and hospitals.  What happens after the procedure?   You may have bleeding after the procedure. This is normal. It varies from light bleeding (spotting) for a few days to menstrual-like bleeding.   You may have cramping and pain.   You may feel dizzy or light-headed.   You may have lower back pain.  Summary   An intrauterine device (IUD) is a small, T-shaped device that has one or two nylon strings hanging down from it.   Two types of IUDs are available. You may have a copper IUD or a hormone IUD.   Schedule the IUD insertion for when you will have your menstrual period or right after, to make sure you are not pregnant. Placement of the IUD is better tolerated shortly after a menstrual cycle.   You may have bleeding after the procedure. This is normal. It varies from light spotting for a few days to menstrual-like bleeding.  This information is not intended to replace advice given to you by your health care provider. Make sure you discuss any questions you have with your health care provider.  Document Released: 03/22/2011 Document Revised: 06/14/2016 Document Reviewed: 06/14/2016  Elsevier Interactive Patient Education  2017 [employer] Inc.    Eat something light before your appointment.  Don't urinate just before arrival, because you will give a urine sample for a pregnancy test.  To manage any pain, take an over the counter medication for cramping like:  Ibuprofen (Advil) Take 3-4 of 200 mg tablets 30 minutes prior to the appointment  Naproxen (Aleve) Take 2 of the 220 mg tablets 30 minutes prior to the appointment  1000 mg  of Tylenol (acetaminophen).    *If you are allergic to ibuprofen, Tylenol or naproxen or should be avoiding these kinds of medications, consult with a provider.    If you have any questions or concerns, please feel free to  give [place] a call at [phone].      Barriers to Learning assessed: none. Patient verbalizes understanding of teaching and instructions.    Return for IUD insertion.    Charleston Poot, MD

## 2018-02-04 ENCOUNTER — Encounter (INDEPENDENT_AMBULATORY_CARE_PROVIDER_SITE_OTHER): Payer: Self-pay

## 2018-02-04 ENCOUNTER — Encounter (INDEPENDENT_AMBULATORY_CARE_PROVIDER_SITE_OTHER): Payer: Self-pay | Admitting: Family Medicine

## 2018-02-04 DIAGNOSIS — B9689 Other specified bacterial agents as the cause of diseases classified elsewhere: Secondary | ICD-10-CM

## 2018-02-04 MED ORDER — METRONIDAZOLE 0.75 % VA GEL
1.0000 | Freq: Every evening | VAGINAL | 0 refills | Status: DC
Start: 2018-02-04 — End: 2018-05-06

## 2018-02-09 ENCOUNTER — Encounter (INDEPENDENT_AMBULATORY_CARE_PROVIDER_SITE_OTHER): Payer: Self-pay | Admitting: Family Medicine

## 2018-02-25 ENCOUNTER — Telehealth (INDEPENDENT_AMBULATORY_CARE_PROVIDER_SITE_OTHER): Payer: Self-pay | Admitting: Family Medicine

## 2018-02-25 NOTE — Telephone Encounter (Signed)
Patient has been approved for Mirena Interuterine Device, Please review, order device, and contact patient to schedule

## 2018-04-02 ENCOUNTER — Telehealth (INDEPENDENT_AMBULATORY_CARE_PROVIDER_SITE_OTHER): Payer: Self-pay | Admitting: Family Medicine

## 2018-04-02 ENCOUNTER — Encounter (INDEPENDENT_AMBULATORY_CARE_PROVIDER_SITE_OTHER): Payer: Self-pay | Admitting: Family Medicine

## 2018-04-02 NOTE — Telephone Encounter (Signed)
No answer, left voice mail to call back. Pt already has an appt on 8/29  Mychart sent                Future Appointments   Date Time Provider Department Center   04/04/2018  4:00 PM [place] [facility]bi[facility]

## 2018-04-02 NOTE — Telephone Encounter (Signed)
Routing message as telephone triage    Closing encounter

## 2018-04-02 NOTE — Telephone Encounter (Signed)
From: Lavona MoundGloria Cristina Barbato  To: [place] Pootelebi, Julie M, MD  Sent: 04/02/2018 10:54 AM PDT  Subject: 1-Non Urgent Medical Advice    Hi dr. I noticed a month and half ago the right side of my head near the temple I was losing a lot of hair. Is there anything you recommend me doing? I stopped breastfeeding around the same time since Delilah didn't want the breast any more. I took this picture this morning 08/27

## 2018-04-02 NOTE — Telephone Encounter (Signed)
Symptom Call          Next office visit:  04/04/2018    What symptom is the patient experiencing? Patient is calling regarding her hair. Patient states she is losing hair on the right side of her head only.       Name of PCP Provider: [place] Pootelebi, Julie M   Insurance Coverage Verified: Active- in network  Last office visit: 01/31/2018    Who is reporting the symptoms? Incoming call from patient    Is this a new or ongoing symptom? new  Estimated time since experiencing symptom(s)? 2 weeks     Best way to contact patient: [phone] (mobile)   Alternative communication method: [phone] (mobile)   @HOMEPHONE @  @WORKPHONE @            *Select a method of escalation for this call:     1.   2. *Cold transfer to triage nurse to extension #[id number]

## 2018-04-02 NOTE — Telephone Encounter (Signed)
Routing patient message as telephone triage          Hi dr. I noticed a month and half ago the right side of my head near the temple I was losing a lot of hair. Is there anything you recommend me doing? I stopped breastfeeding around the same time since Belinda Watson didn't want the breast any more. I took this picture this morning 08/27

## 2018-04-02 NOTE — Telephone Encounter (Signed)
MD ACTION REQUESTED: NO/FYI only    RN ACTION: Pt states she can wait to discuss hair loss at appt on 04/04/18.   Merge down applied: No  Decision Support tool used: Adult Telephone Protocols 4th Edition; Janee Mornhompson    Reason for call: pt calling back re: hair loss  CC: hair loss  Onset: 1.5 months  Associated symptoms: no    Does anything make it worse? no   Have you tried anything to relieve your symptoms? no  Do you feel this issue can wait for you to see your PCP or do you want to be seen sooner by any provider?  Has an appt on 8/29    DENIES: fever, chills, palpitations, diff breathing, SOB, chest pain    ER Precautions reviewed: call back to further discuss    Patient verbalizes understanding and agrees with plan of care.  RN Encouraged patient to call back PRN or if symptoms worsen or persist.     [place] General Risk Score 0    Pertinent PMHx:   Patient Active Problem List   Diagnosis   . Inadequate exercise - not at goal       Future Appointments   Date Time Provider Department Center   04/04/2018  4:00 PM [place] Pootelebi, Julie M, [facility]     Last visit in this department 01/31/2018  Next visit in this department 04/04/2018  RN confirmed pt's pharmacy and allergies - n/a    Date: 04/02/2018   Time: 3:34 PM   Name of PCP Provider: [place] Pootelebi, Julie M     Direct call transfer to Triage RN  Patient name: Belinda Watson 27 year old   Verified DOB  Pt advised all calls are recorded for quality assurance.

## 2018-04-04 ENCOUNTER — Other Ambulatory Visit: Payer: TRICARE Prime—HMO | Attending: Family Medicine | Admitting: Family Medicine

## 2018-04-04 VITALS — BP 116/75 | HR 110 | Temp 97.4°F | Resp 18 | Ht 66.0 in | Wt 215.0 lb

## 2018-04-04 DIAGNOSIS — L639 Alopecia areata, unspecified: Secondary | ICD-10-CM

## 2018-04-04 DIAGNOSIS — Z30430 Encounter for insertion of intrauterine contraceptive device: Secondary | ICD-10-CM | POA: Insufficient documentation

## 2018-04-04 DIAGNOSIS — Z113 Encounter for screening for infections with a predominantly sexual mode of transmission: Principal | ICD-10-CM | POA: Insufficient documentation

## 2018-04-04 LAB — URINE PREGNANCY TEST POCT: HCG, Urine: NEGATIVE

## 2018-04-04 MED ORDER — LEVONORGESTREL 20 MCG/24HR IU IUD
1.0000 | INTRAUTERINE_SYSTEM | Freq: Once | INTRAUTERINE | Status: AC
Start: 2018-04-04 — End: 2018-04-04
  Administered 2018-04-04: 1 via INTRAUTERINE

## 2018-04-04 NOTE — Patient Instructions (Addendum)
Continue minipill for 1 week for backup method   We will do a steroid injection of your bald patch as alopecia areata treatment    Intrauterine Device Insertion  An intrauterine device (IUD) is a medical device that gets inserted into the uterus to prevent pregnancy. It is a small, T-shaped device that has one or two nylon strings hanging down from it. The strings hang out of the lower part of the uterus (cervix) to allow for future IUD removal. There are two types of IUDs available:   Copper IUD. This type of IUD has copper wire wrapped around it. Copper makes the uterus and fallopian tubes produce a fluid that kills sperm. A copper IUD may last up to 10 years.   Hormone IUD. This type of IUD is made of plastic and contains the hormone progestin (synthetic progesterone). The hormone thickens mucus in the cervix and prevents sperm from entering the uterus. It also thins the uterine lining to prevent implantation of a fertilized egg. The hormone can weaken or kill the sperm that get into the uterus. A hormone IUD may last 3-5 years.    Tell a health care provider about:   Any allergies you have.   All medicines you are taking, including vitamins, herbs, eye drops, creams, and over-the-counter medicines.   Any problems you or family members have had with anesthetic medicines.   Any blood disorders you have.   Any surgeries you have had.   Any medical conditions you have, including any STIs (sexually transmitted infections) you may have.   Whether you are pregnant or may be pregnant.  What are the risks?  Generally, this is a safe procedure. However, problems may occur, including:   Infection.   Bleeding.   Allergic reactions to medicines.   Accidental puncture (perforation) of the uterus, or damage to other structures or organs.   Accidental placement of the IUD either in the muscle layer of the uterus (myometrium) or outside the uterus.   The IUD falling out of the uterus (expulsion). This is more  common among women who have recently had a child.   Pregnancy that happens in the fallopian tube (ectopic pregnancy).   Infection of the uterus and fallopian tubes (pelvic inflammatory disease).    What happens before the procedure?   Schedule the IUD insertion for when you will have your menstrual period or right after, to make sure you are not pregnant. Placement of the IUD is better tolerated shortly after a menstrual cycle.   Follow instructions from your health care provider about eating or drinking restrictions.   Ask your health care provider about changing or stopping your regular medicines. This is especially important if you are taking diabetes medicines or blood thinners.   You may get a pain reliever to take before the procedure.   You may have tests for:  ? Pregnancy. A pregnancy test involves having a urine sample taken.  ? STIs. Placing an IUD in someone who has an STI can make the infection worse.  ? Cervical cancer. You may have a Pap test to check for this type of cancer. This means collecting cells from your cervix to be examined under a microscope.   You may have a physical exam to determine the size and position of your uterus.  The procedure may vary among health care providers and hospitals.  What happens during the procedure?   A tool (speculum) will be placed in your vagina and widened so that  your health care provider can see your cervix.   Medicine may be applied to your cervix to help lower your risk of infection (antiseptic medicine).   You may be given an anesthetic medicine to numb each side of your cervix (intracervical block or paracervical block). This medicine is usually given by an injection into the cervix.   A tool (uterine sound) will be inserted into your uterus to determine the length of your uterus and the direction that your uterus may be tilted.   A slim instrument or tube (IUD inserter) that holds the IUD will be inserted into your vagina, through your  cervical canal, and into your uterus.   The IUD will be placed in the uterus, and the IUD inserter will be removed.   The strings that are attached to the IUD will be trimmed so that they lie just below the cervix.  The procedure may vary among health care providers and hospitals.  What happens after the procedure?   You may have bleeding after the procedure. This is normal. It varies from light bleeding (spotting) for a few days to menstrual-like bleeding.   You may have cramping and pain.   You may feel dizzy or light-headed.   You may have lower back pain.  Summary   An intrauterine device (IUD) is a small, T-shaped device that has one or two nylon strings hanging down from it.   Two types of IUDs are available. You may have a copper IUD or a hormone IUD.   Schedule the IUD insertion for when you will have your menstrual period or right after, to make sure you are not pregnant. Placement of the IUD is better tolerated shortly after a menstrual cycle.   You may have bleeding after the procedure. This is normal. It varies from light spotting for a few days to menstrual-like bleeding.  This information is not intended to replace advice given to you by your health care provider. Make sure you discuss any questions you have with your health care provider.  Document Released: 03/22/2011 Document Revised: 06/14/2016 Document Reviewed: 06/14/2016  Elsevier Interactive Patient Education  2017 [employer]

## 2018-04-04 NOTE — Progress Notes (Signed)
Family Medicine Clinic Progress Note    CC:   Chief Complaint   Patient presents with   . IUD     placement   . IUD         Subjective: Belinda Watson is a 27 year old female who is here for CC above.    Here for IUD insertion  Of note, Pt has a bald patch on the R side of her scalp that is new. No other areas of hair loss. No family history of alopecia or other autoimmune diseases.    ROS: as per HPI    PMH reviewed/updated.    Patient Active Problem List   Diagnosis   . Inadequate exercise - not at goal       Medications reviewed with patient and medication list reconciled.   Patient's understanding and response to medications assessed.     Outpatient Medications Prior to Visit   Medication Sig Dispense Refill   . metroNIDAZOLE (METROGEL) 0.75 % vaginal gel Insert 1 Applicatorful vaginally nightly. 70 g 0   . norethindrone (ORTHO MICRONOR) 0.35 MG tablet Take 1 tablet by mouth daily. 28 tablet 11   . Prenatal Vit-Fe Fumarate-FA (PRENATAL VITAMIN PO)        No facility-administered medications prior to visit.          There is no immunization history on file for this patient.  No Known Allergies    Objective:  Vitals:    04/04/18 1620   BP: 116/75   Pulse: 110   Resp: 18   Temp: 97.4 F (36.3 C)   TempSrc: Oral   SpO2: 98%   Weight: 97.5 kg (215 lb)   Height: 5\' 6"  (1.676 m)     Estimated body mass index is 34.7 kg/m as calculated from the following:    Height as of this encounter: 5\' 6"  (1.676 m).    Weight as of this encounter: 97.5 kg (215 lb).      General Appearance: healthy, alert, no distress, pleasant affect, cooperative.  Gen: alert, pleasant, no apparent distress   Uterus: normal sized  Adnexae:  no masses or tenderness  Skin: ~3x3cm focal patch of hair loss on R parietal scalp    Labs reviewed:  URINE PREGNANCY TEST POCT    Collection Time: 04/04/18  4:20 PM   Result Value Ref Range    HCG, Urine NEGATIVE     Preg Test, Control VISIBLE          IUD Insertion    Belinda Watson is a 27 year old  [id number] who presents for IUD insertion.  Current BCM: oral progesterone-only contraceptive    Reviewed alternative methods of contraception available and patient elects for a Mirena IUD.     LMP: Patient's last menstrual period was 03/30/2018 (exact date).  Unprotected intercourse since last menses? NO  Pregnancy Test:Neg  GC/CT: indicated and collected    I have reviewed patient's medical history and she has has no contraindications to Mirena IUD.    A complete discussion of the risks and benefits of IUD use and the details of the insertion procedure was held with the patient. Discussed risks of bleeding, infection, perforation, expulsion, failure (increased risk of ectopic), irregular bleeding patterns, pain with insertion, vasovagal reaction. All questions were answered.  A consent form was signed.      Time Out: performed at 1630.    Correct patient:  yes  Correct procedure:  yes  Correct equipment and/or implants:  yes   Patients allergies were confirmed.     Speculum inserted in vagina and cervix visualized.  Cervix cleansed with Betadine.  Uterus sounded to 8 cm.  Mirena IUD inserted using sterile technique.  Strings trimmed to 4 cm.  Tenaculum removed and tenaculum sites hemostatic.  Patient stable after procedure.      Assessment/Plan: Greta DoomGloria Cristina Knechtel is a 27 year old female with the following issues addressed:    1. Encounter for IUD insertion  - uncomplicated IUD insertion  - reviewed efficacy, possible bleeding patterns and recommended self-check of strings after each menses  - instructed to use back up method x 7 days if applicable  - counseled  IUD does not provide STI protection and that should be combined with condom use if at risk.  - return to clinic in approximately 4-6 weeks for a IUD check.  - URINE PREGNANCY TEST POCT  - levonorgestrel (MIRENA) 52 mg IUD 1 Device    2. Routine screening for STI (sexually transmitted infection)  - Chlamydia/GC PCR, Genital Cobas PCR collection swab    3.  Alopecia areata  Plan for return for intralesional steroid injection.  - Family Medicine Clinic          Patient Instructions    Continue minipill for 1 week for backup method   We will do a steroid injection of your bald patch as alopecia areata treatment        Barriers to Learning assessed: none. Patient verbalizes understanding of teaching and instructions.    Return in about 6 weeks (around 05/16/2018) for string check, alopecia injection.    Charleston PootJulie M Lucresia Simic, MD

## 2018-04-05 LAB — CHLAMYDIA/GONORRHEA PCR, GENITAL
Chlamydia trachomatis PCR: NOT DETECTED
Neisseria gonorrhoeae PCR: NOT DETECTED

## 2018-04-09 ENCOUNTER — Encounter (INDEPENDENT_AMBULATORY_CARE_PROVIDER_SITE_OTHER): Payer: Self-pay | Admitting: Family Medicine

## 2018-04-10 ENCOUNTER — Encounter (INDEPENDENT_AMBULATORY_CARE_PROVIDER_SITE_OTHER): Payer: Self-pay | Admitting: Family Medicine

## 2018-04-10 NOTE — Telephone Encounter (Signed)
From: Lavona Mound Radice  To: Charleston Poot, MD  Sent: 04/10/2018 12:07 AM PDT  Subject: 1-Non Urgent Medical Advice    Hi dr.,   on 04/08/18, I lost a toe nail... completely. I went to [facility] so they could remove it completely since it was just hanging on to the toe. I'm not sure if you'd like to take a look at it, I've never lost a nail before so I'm not sure how the healing process looks. I know that i did have an ingrown toenail on the outer part of my toe which I think might have been the reason it came off so easily. I asked them how to treat the healing process and they told me just soap and water. Here is a picture I took right now at 12:00am 04/10/18.

## 2018-05-23 ENCOUNTER — Ambulatory Visit (INDEPENDENT_AMBULATORY_CARE_PROVIDER_SITE_OTHER): Payer: TRICARE Prime—HMO | Admitting: Family Medicine

## 2018-05-23 ENCOUNTER — Encounter (INDEPENDENT_AMBULATORY_CARE_PROVIDER_SITE_OTHER): Payer: Self-pay | Admitting: Family Medicine

## 2018-05-23 VITALS — BP 105/72 | HR 91 | Temp 97.1°F | Resp 16 | Ht 66.0 in | Wt 216.8 lb

## 2018-05-23 DIAGNOSIS — L639 Alopecia areata, unspecified: Secondary | ICD-10-CM

## 2018-05-23 DIAGNOSIS — Z30431 Encounter for routine checking of intrauterine contraceptive device: Principal | ICD-10-CM

## 2018-05-23 DIAGNOSIS — Z23 Encounter for immunization: Secondary | ICD-10-CM

## 2018-05-23 DIAGNOSIS — Z1331 Encounter for screening for depression: Secondary | ICD-10-CM

## 2018-05-23 DIAGNOSIS — Z789 Other specified health status: Secondary | ICD-10-CM

## 2018-05-23 MED ORDER — TRIAMCINOLONE ACETONIDE 10 MG/ML IJ SUSP
10.00 mg | Freq: Once | INTRAMUSCULAR | Status: AC
Start: 2018-05-23 — End: 2018-05-24
  Administered 2018-05-24: 10 mg via INTRALESIONAL

## 2018-05-23 NOTE — Interdisciplinary (Signed)
Pt given  Influenza vaccine per orders. VIS given to pt.  Pt advised that best practice is to wait in clinic or in reception area for 15 minutes after vaccine administration to watch for possible allergic reaction symptoms. If pt experiences any any symptoms that cause concern pt is to notify front desk staff or clinic staff immediately. Pt verbalized good understanding.    NDC# 16109-604-54  Lot # [id number]  Expires: 02/04/19     Claris Gladden, MSN, RN   [facility]  Float/ Triage Nurse,   Email: [email or web address]

## 2018-05-23 NOTE — Progress Notes (Signed)
Family Medicine Clinic Progress Note    CC:   Chief Complaint   Patient presents with   . Other     Steroid Injection    . Other     string check          Subjective: Belinda Watson is a 27 year old female who is here for CC above.      Here for steroid injection of bald patch, feels it has gotten bigger  No family history of alopecia or autoimmune disease that she is aware of    Doing well with IUD, did have some prolonged spotting for about 3wk but did eventually stop  Has not tried to check for the strings herself    She has been getting some pains on either side of her lower abdomen periodically with exercise    ROS: as per HPI    PMH reviewed/updated.    Patient Active Problem List   Diagnosis   . Inadequate exercise - not at goal   . Adequate exercise, at or above goal       Medications reviewed with patient and medication list reconciled.   Patient's understanding and response to medications assessed.     No outpatient medications prior to visit.     No facility-administered medications prior to visit.          There is no immunization history on file for this patient.  No Known Allergies    Objective:  Vitals:    05/23/18 0700   BP: 105/72   BP Location: Left arm   BP Patient Position: Sitting   BP cuff size: Regular   Pulse: 91   Resp: 16   Temp: 97.1 F (36.2 C)   TempSrc: Oral   SpO2: 98%   Weight: 98.3 kg (216 lb 12.8 oz)   Height: 5\' 6"  (1.676 m)     Estimated body mass index is 34.99 kg/m as calculated from the following:    Height as of this encounter: 5\' 6"  (1.676 m).    Weight as of this encounter: 98.3 kg (216 lb 12.8 oz).        General Appearance: healthy, alert, no distress, pleasant affect, cooperative.  Skin: 3.5x3.5cm area of complete hair loss on R parietal scalp  Pelvic:  External genitalia and vagina normal. 2cm IUD strings protruding from cervical os.      PROCEDURE: Intralesional Injection    Intralesional injection directly into the skin lesion using fine needle after cleaning  site with alcohol/antiseptic solution at site: R parietal scalp.     Medication: 1cc of Kenalog 10mg /mL    Patient tolerated procedure well and post care instructions given to patient.      Reviewed and Signed By Charleston Poot, MD        Assessment/Plan: Belinda Watson is a 27 year old female with the following issues addressed:    1. IUD check up  In appropriate position, follow up prn.    2. Alopecia areata  Provided info to Pt on alopecia condition. 10mg  Kenalog today - f/u in about a month to assess response.  - triamcinolone acetonide (KENALOG) injection 10 mg    3. Increased physical activity  PAVS Total Activity: 150 minutes/week minutes/week       Using a patient-centered motivational interviewing approach to shared decision making I have encouraged ms. Alfred to maintain her current physical activity. Unable to explore abdominal pain complaint in depth due to time constraints but if  ongoing/progressive issue, should evaluate for possible occult hernia.    4. Screened negative for depression    5. Need for vaccination  - Flu Vaccine (6 months+)        Patient Instructions    Apply the numbing cream to your bald patch about 1.5hr before your next appt   Keep an eye on the abdominal pain - I think it could be abdominal wall hernias but we will watch for now        Barriers to Learning assessed: none. Patient verbalizes understanding of teaching and instructions.    Return in about 1 month (around 06/23/2018) for repeat steroid injection.    Charleston Poot, MD

## 2018-05-23 NOTE — Interdisciplinary (Signed)
Patient given Influenza  vaccine per order.   VIS given to patient prior to administration.   Patient tolerated injection well with no complaints.  Patient advised that best practice is to wait in clinic or in waiting room for 15 minutes after vaccine administration to watch for possible allergic reaction symptoms.  If patient experiences any symptoms that cause concern patient is to notify front desk staff or clinic staff immediately.        Vaccine: Influenza  Lot # [id number]  Exp: 02/04/19    Verified by Joe RN

## 2018-05-23 NOTE — Patient Instructions (Addendum)
Apply the numbing cream to your bald patch about 1.5hr before your next appt   Keep an eye on the abdominal pain - I think it could be abdominal wall hernias but we will watch for now      Alopecia Areata, Adult  Alopecia areata is a condition that causes you to lose hair. You may lose hair on your scalp in patches. In some cases, you may lose all the hair on your scalp (alopecia totalis) or all the hair from your face and body (alopecia universalis).  Alopecia areata is an autoimmune disease. This means that your body's defense system (immune system) mistakes normal parts of the body for germs or other things that can make you sick. When you have alopecia areata, the immune system attacks the hair follicles.  Alopecia areata usually develops in childhood, but it can develop at any age. For some people, their hair grows back on its own and hair loss does not happen again. For others, their hair may fall out and grow back in cycles. The hair loss may last many years. Having this condition can be emotionally difficult, but it is not dangerous.  What are the causes?  The cause of this condition is not known.  What increases the risk?  This condition is more likely to develop in people who have:   A family history of alopecia.   A family history of another autoimmune disease, including type 1 diabetes and rheumatoid arthritis.   Asthma and allergies.   Down syndrome.    What are the signs or symptoms?  Round spots of patchy hair loss on the scalp is the main symptom of this condition. The spots may be mildly itchy. Other symptoms include:   Short dark hairs in the bald patches that are wider at the top (exclamation point hairs).   Dents, white spots, or lines in the fingernails or toenails.   Balding and body hair loss. This is rare.    How is this diagnosed?  This condition is diagnosed based on your symptoms and family history. Your health care provider will also check your scalp skin, teeth, and nails. Your  health care provider may refer you to a specialist in hair and skin disorders (dermatologist). You may also have tests, including:   A hair pull test.   Blood tests or other screening tests to check for autoimmune diseases, such as thyroid disease or diabetes.   Skin biopsy to confirm the diagnosis.   A procedure to examine the skin with a lighted magnifying instrument (dermoscopy).    How is this treated?  There is no cure for alopecia areata. Treatment is aimed at promoting the regrowth of hair and preventing the immune system from overreacting. No single treatment is right for all people with alopecia areata. It depends on the type of hair loss you have and how severe it is. Work with your health care provider to find the best treatment for you. Treatment may include:   Having regular checkups to make sure the condition is not getting worse (watchful waiting).   Steroid creams or pills for 6-8 weeks to stop the immune reaction and help hair to regrow more quickly.   Other topical medicines to alter the immune system response and support the hair growth cycle.   Steroid injections.   Therapy and counseling with a support group or therapist if you are having trouble coping with hair loss.    Follow these instructions at home:  Learn as much as you can about your condition.   Apply topical creams only as told by your health care provider.   Take over-the-counter and prescription medicines only as told by your health care provider.   Consider getting a wig or products to make hair look fuller or to cover bald spots, if you feel uncomfortable with your appearance.   Get therapy or counseling if you are having a hard time coping with hair loss. Ask your health care provider to recommend a counselor or support group.   Keep all follow-up visits as told by your health care provider. This is important.  Contact a health care provider if:   Your hair loss gets worse, even with treatment.   You have new  symptoms.   You are struggling emotionally.  Summary   Alopecia areata is an autoimmune condition that makes your body's defense system (immune system) attack the hair follicles. This causes you to lose hair.   Treatments may include regular checkups to make sure that the condition is not getting worse (watchful waiting), medicines, and steroid injections.  This information is not intended to replace advice given to you by your health care provider. Make sure you discuss any questions you have with your health care provider.  Document Released: 02/26/2004 Document Revised: 08/11/2016 Document Reviewed: 08/11/2016  Elsevier Interactive Patient Education  2018 [employer].

## 2018-05-29 ENCOUNTER — Telehealth (INDEPENDENT_AMBULATORY_CARE_PROVIDER_SITE_OTHER): Payer: Self-pay

## 2018-05-29 NOTE — Telephone Encounter (Signed)
Health Coach contacted pt in response to [facility] referral placed by Dr.Celebi. Spoke directly with pt. Pt is no longer interested in health coaching.

## 2018-06-13 ENCOUNTER — Encounter (INDEPENDENT_AMBULATORY_CARE_PROVIDER_SITE_OTHER): Payer: Self-pay | Admitting: Family Medicine

## 2018-06-13 ENCOUNTER — Encounter (INDEPENDENT_AMBULATORY_CARE_PROVIDER_SITE_OTHER): Payer: TRICARE Prime—HMO

## 2018-06-13 ENCOUNTER — Telehealth (INDEPENDENT_AMBULATORY_CARE_PROVIDER_SITE_OTHER): Payer: Self-pay

## 2018-06-13 NOTE — Telephone Encounter (Signed)
Hi dr. So the past couple of days I want to say since Tuesday I've had loose stool which I'm not sure why. This morning(11/07)Ive had slippery stool and just now around 12pm I used the restroom and it was sticky/ gooey with what seems to be blood.i had coffee and egg sandwich w/ bacon.... if that matters. I haven't had to strain nor nothing and I just find it abnormal. What do you recommend me doing ? Sorry for the [employer]

## 2018-06-13 NOTE — Telephone Encounter (Signed)
From: Belinda Watson  To: Charleston Poot, MD  Sent: 06/13/2018 12:13 PM PST  Subject: 20-Other    Hi dr. So the past couple of days I want to say since Tuesday I've had loose stool which I'm not sure why. This morning(11/07)Ive had slippery stool and just now around 12pm I used the restroom and it was sticky/ gooey with what seems to be blood.i had coffee and egg sandwich w/ bacon.... if that matters. I haven't had to strain nor nothing and I just find it abnormal. What do you recommend me doing ? Sorry for the [employer]

## 2018-06-13 NOTE — Telephone Encounter (Signed)
MD ACTION REQUESTED: NO/FYI only    RN ACTION: Pt advised to seek [facility]     Merge down applied: No  Decision Support tool used: Adult Telephone Protocols 4th Edition; [name]    Reason for call: Pt states, she ate corn cob from [place] and that's the only thing.   The experienced symptoms below.    CC:   1. Loose and some mucus in the stool    2. Some blood when she wiped.  Onset:   Tuesday      Associated symptoms: none    Does anything make it worse?  none  Have you tried anything to relieve your symptoms? none    DENIES: trauma, abdominal pain, fever, chills, rash, blurry vision, dizziness, impaired hearing, stiff neck, weakness  Denies: SOB, chest pain, palpitations, dyspnea, dysphagia, nausea or vomiting.  Denies: back/flank pain, dysuria, hematuria, hematemesis.    ER Precautions reviewed: chest pain, sob, severe headache, numbness or weakness on one side, feeling faint/dizziness, confusion, slurred speech.  Or worsening symptoms.    Patient verbalizes understanding and agrees with plan of care.  RN Encouraged patient to call back PRN or if symptoms worsen or persist.     [place] General Risk Score 0    Pertinent PMHx:   Patient Active Problem List   Diagnosis   . Inadequate exercise - not at goal   . Adequate exercise, at or above goal       Future Appointments   Date Time Provider Department Center   06/28/2018  3:00 PM Charleston Poot, MD [facility]       RN confirmed pt's pharmacy and allergies   [facility]  [address]  [place] [address]  Phone: [phone]        Date: 06/13/2018   Time: 3:22 PM   Name of PCP Provider: Charleston Poot     Direct call transfer to Triage RN  Patient name: Belinda Watson 27 year old   Verified DOB  Pt advised all calls are recorded for quality assurance.

## 2018-06-13 NOTE — Telephone Encounter (Signed)
Routed to triage and PCP.

## 2018-06-28 ENCOUNTER — Encounter (INDEPENDENT_AMBULATORY_CARE_PROVIDER_SITE_OTHER): Payer: Self-pay | Admitting: Family Medicine

## 2018-06-28 ENCOUNTER — Ambulatory Visit (INDEPENDENT_AMBULATORY_CARE_PROVIDER_SITE_OTHER): Payer: TRICARE Prime—HMO | Admitting: Family Medicine

## 2018-06-28 ENCOUNTER — Other Ambulatory Visit: Payer: Self-pay

## 2018-06-28 VITALS — BP 124/79 | HR 79 | Temp 99.2°F | Resp 20 | Ht 66.0 in | Wt 213.0 lb

## 2018-06-28 DIAGNOSIS — L639 Alopecia areata, unspecified: Secondary | ICD-10-CM

## 2018-06-28 DIAGNOSIS — Z1331 Encounter for screening for depression: Secondary | ICD-10-CM

## 2018-06-28 DIAGNOSIS — Z1339 Encounter for screening examination for other mental health and behavioral disorders: Secondary | ICD-10-CM

## 2018-06-28 DIAGNOSIS — Z723 Lack of physical exercise: Secondary | ICD-10-CM

## 2018-06-28 DIAGNOSIS — R030 Elevated blood-pressure reading, without diagnosis of hypertension: Secondary | ICD-10-CM

## 2018-06-28 DIAGNOSIS — Z1389 Encounter for screening for other disorder: Secondary | ICD-10-CM

## 2018-06-28 MED ORDER — TRIAMCINOLONE ACETONIDE 10 MG/ML IJ SUSP
10.00 mg | Freq: Once | INTRAMUSCULAR | Status: AC
Start: 2018-06-28 — End: 2018-06-28
  Administered 2018-06-28: 10 mg via INTRALESIONAL

## 2018-06-28 NOTE — Progress Notes (Signed)
Pt here for repeat steroid injection, had a good response to last injection.    PROCEDURE: Intralesional Injection    Intralesional injection directly into the skin lesion using fine needle after cleaning site with alcohol/antiseptic solution at site: R parietal scalp.    Medication:1cc of Kenalog 10mg /mL    Patient tolerated procedure well and post care instructions given to patient.    1. Alopecia areata  - triamcinolone acetonide (KENALOG) injection 10 mg    2. Elevated blood pressure reading without diagnosis of hypertension  Will CTM    3. Inadequate exercise - not at goal  PAVS Total Activity: (P) 90 minutes/week minutes/week  Referral to [facility]    4. Screened negative for alcohol use    5. Screened negative for depression    6. Screened negative for drug use      Vivien RossettiJulie Marie Nikaya Nasby, MD

## 2018-07-17 ENCOUNTER — Telehealth (INDEPENDENT_AMBULATORY_CARE_PROVIDER_SITE_OTHER): Payer: Self-pay

## 2018-07-17 NOTE — Telephone Encounter (Signed)
Health Coach contacted pt in response to [facility] ([facility]) referral placed by Dr.Celebi. Left vm and invited pt to respond to MyChart message if interested in speaking with Health Coach about exercise.

## 2018-07-25 NOTE — Telephone Encounter (Signed)
Health Coach f/up with pt at preferred time. Pt did not answer phone. Health coach invited pt to reply via MyChart if interested in re-scheduling phone call re: EIM

## 2018-08-06 ENCOUNTER — Encounter (INDEPENDENT_AMBULATORY_CARE_PROVIDER_SITE_OTHER): Payer: Self-pay | Admitting: Family Medicine

## 2018-08-23 ENCOUNTER — Encounter (INDEPENDENT_AMBULATORY_CARE_PROVIDER_SITE_OTHER): Payer: TRICARE Prime—HMO | Admitting: Family Medicine

## 2018-09-10 ENCOUNTER — Ambulatory Visit (INDEPENDENT_AMBULATORY_CARE_PROVIDER_SITE_OTHER): Payer: TRICARE Prime—HMO | Admitting: Family Medicine

## 2018-09-10 ENCOUNTER — Encounter (INDEPENDENT_AMBULATORY_CARE_PROVIDER_SITE_OTHER): Payer: Self-pay | Admitting: Family Medicine

## 2018-09-10 VITALS — BP 113/68 | HR 98 | Temp 97.4°F | Resp 18 | Ht 66.0 in | Wt 216.4 lb

## 2018-09-10 DIAGNOSIS — B078 Other viral warts: Secondary | ICD-10-CM

## 2018-09-10 DIAGNOSIS — L918 Other hypertrophic disorders of the skin: Secondary | ICD-10-CM

## 2018-09-10 DIAGNOSIS — L639 Alopecia areata, unspecified: Secondary | ICD-10-CM

## 2018-09-10 DIAGNOSIS — L638 Other alopecia areata: Secondary | ICD-10-CM

## 2018-09-10 NOTE — Progress Notes (Signed)
Pt here for repeat steroid injection, had a good response to last injection.    She also has a couple skin tags that have been bothering her that she would like treated.    A Time Out was conducted immediately prior to the procedure in agreement with the patient/team confirming: patient name, MRN and date of birth, consent, procedure, site, side, marking, and correct equipment/implant, if applicable.      PROCEDURE:Intralesional Injection    Intralesional injection directly into the skin lesion using fine needle after cleaning site with alcohol/antiseptic solution at site: R parietal scalp.    Medication:1cc of Kenalog 10mg /mL    Patient tolerated procedure well and post care instructions given to patient.  10mg  Kenalog    PROCEDURE: Cryotherapy  R/B/A's of cryo discussed, Pt provided verbal consent to proceed. Lesions on R anterior neck, L chest frozen with cryo gun through 3 freeze-thaw cycles. Pt tolerated without issue.    Vivien Rossetti, MD

## 2018-09-16 MED ORDER — TRIAMCINOLONE ACETONIDE 10 MG/ML IJ SUSP
10.0000 mg | Freq: Once | INTRAMUSCULAR | Status: AC
Start: 2018-09-10 — End: 2018-09-11

## 2018-10-28 NOTE — Interdisciplinary (Signed)
Medication: 1cc of Kenalog 10mg /ml  NDC#: 7096-4383-81  Administered by Dr. Delice Lesch

## 2019-02-20 ENCOUNTER — Telehealth (INDEPENDENT_AMBULATORY_CARE_PROVIDER_SITE_OTHER): Payer: Self-pay | Admitting: Family Medicine

## 2019-02-20 ENCOUNTER — Encounter (INDEPENDENT_AMBULATORY_CARE_PROVIDER_SITE_OTHER): Payer: Self-pay | Admitting: Family Medicine

## 2019-02-20 NOTE — Telephone Encounter (Signed)
Please advise    Hi dr. Salvadore Dom,   I have question, Do I need an appointment in order to remove my IUD birth control or can I do it myself? My family and I are moving to [place] at the end of the month and i wanted to see if I can get it removed before we leave. I didn't see anything available until august 21st... where would you recommend me going to get it removed?

## 2019-02-20 NOTE — Telephone Encounter (Signed)
Please advise    Sorry also, is there any way I can get something prescribed to stop my lactation? Please and thank you

## 2019-02-20 NOTE — Telephone Encounter (Signed)
From: Tawanna Cooler Gertsch  To: Kenney Houseman, MD  Sent: 02/20/2019 2:37 AM PDT  Subject: 1-Non Urgent Medical Advice    Hi dr. Salvadore Dom,  I have question, Do I need an appointment in order to remove my IUD birth control or can I do it myself? My family and I are moving to [place] at the end of the month and i wanted to see if I can get it removed before we leave. I didn't see anything available until august 21st... where would you recommend me going to get it removed?

## 2019-02-20 NOTE — Telephone Encounter (Signed)
Who is calling: Incoming call from patient  Insurance Coverage Verified: Active- in network  Reason for this call: pt is calling in today to confirm that she will like to take the 07/23 appt at 415 that was offered to her. She is asking for a call back to confirm that the appt has been scheduled since an IUD need to be placed I am not allowed to scheduled the appt as it is a procedure appt. Please secure appt for pt and contact to confirm.     Action required by office: Please contact caller    Duplicate encounter? No previous documentation found on this issue.     Best way to contact: [phone]  Alternative: mychart    Inquiry has been read verbatim to this caller. Verbalizes satisfaction and confirms the above is accurate: yes      Has been advised this message will be transmitted to office and can expect a response within the next 24-72 hours.

## 2019-02-20 NOTE — Telephone Encounter (Signed)
From: Tawanna Cooler Conkle  To: Kenney Houseman, MD  Sent: 02/20/2019 2:43 AM PDT  Subject: 1-Non Urgent Medical Advice    Sorry also, is there any way I can get something prescribed to stop my lactation? Please and thank you

## 2019-02-21 NOTE — Telephone Encounter (Signed)
Appt made with PCP on 7.23.17 at 4:15 ok by PCP

## 2019-02-27 ENCOUNTER — Other Ambulatory Visit: Payer: TRICARE Prime—HMO | Attending: Family Medicine | Admitting: Family Medicine

## 2019-02-27 ENCOUNTER — Other Ambulatory Visit: Payer: Self-pay

## 2019-02-27 VITALS — BP 116/52 | HR 89 | Temp 97.1°F | Resp 16 | Ht 66.0 in | Wt 228.4 lb

## 2019-02-27 DIAGNOSIS — Z309 Encounter for contraceptive management, unspecified: Secondary | ICD-10-CM

## 2019-02-27 DIAGNOSIS — L918 Other hypertrophic disorders of the skin: Secondary | ICD-10-CM

## 2019-02-27 DIAGNOSIS — H9203 Otalgia, bilateral: Secondary | ICD-10-CM

## 2019-02-27 DIAGNOSIS — Z30432 Encounter for removal of intrauterine contraceptive device: Secondary | ICD-10-CM

## 2019-02-27 DIAGNOSIS — N898 Other specified noninflammatory disorders of vagina: Secondary | ICD-10-CM

## 2019-02-27 DIAGNOSIS — H9209 Otalgia, unspecified ear: Secondary | ICD-10-CM

## 2019-02-27 LAB — VAGINOSIS SCREEN
Candida, Vaginal Screen: NEGATIVE
Gardnerella, Vaginal Screen: NEGATIVE
Trichomonas, Vaginal Screen: NEGATIVE

## 2019-02-27 MED ORDER — NORGESTIMATE-ETH ESTRADIOL 0.25-35 MG-MCG OR TABS
1.0000 | ORAL_TABLET | Freq: Every day | ORAL | 3 refills | Status: DC
Start: 2019-02-27 — End: 2021-01-05

## 2019-02-27 NOTE — Patient Instructions (Addendum)
Can try Sudafed for 3 days at a time to help with ear pain/congestion and can also help decrease your breastmilk supply. Can then switch to Zyrtec for allergies thereafter     Use condoms for the first week of being on the birth control pill.

## 2019-02-27 NOTE — Progress Notes (Signed)
Family Medicine Clinic Progress Note    CC:   Chief Complaint   Patient presents with   . IUD Removal   . Derm Problem     skin tag left upper eye lid         Subjective: Belinda Watson is a 28 year old female who is here for CC above.      Would like to have IUD removed, would like to try to conceive in about 6 months    She is still producing a small amount of breast milk (with just gentle expression or sometimes spontaneously), wondering if there is anything she can do, her daughter weaned about a year ago.     Also notices some malodor to vaginal discharge, no new sexual partners    Had wisdom teeth extraction in the last month, has been noticing some inner ear pain B/L, wondering if related or if AOM or allergies. No otic drainage, no fevers.     Also has a bothersome skin tag on her eyelid    Moving to [place] imminently, taking precautions with pandemic.      ROS: as per HPI    PMH reviewed/updated.    Patient Active Problem List   Diagnosis   . Inadequate exercise - not at goal   . Adequate exercise, at or above goal       Medications reviewed with patient and medication list reconciled.   Patient's understanding and response to medications assessed.     No outpatient medications prior to visit.     No facility-administered medications prior to visit.        Immunization History   Administered Date(s) Administered   . (Chicken Pox) Varicella Vaccine 08/25/1997, 11/22/2005   . DTP 11/18/1992, 01/18/1993, 03/23/1993, 01/05/1994, 08/25/1997   . HPV-4 Vaccine (GARDASIL-4) 04/03/2007, 02/04/2008, 01/11/2009   . Haemophilus B (Hib) 4 Dose Sch 11/18/1992, 01/18/1993, 03/23/1993, 01/05/1994   . Hep-A Ped/Adol 2 Dose Schedule 02/28/2005, 11/22/2005   . Hepatitis B vaccine (adult 20 yrs and older) 10/08/1992, 11/18/1992, 03/20/1993   . Inactivated Polio Vaccine (IPV) 11/18/1992, 01/18/1993, 03/23/1993, 08/25/1997   . Influenza Vaccine (Unspecified) 07/04/2017   . Influenza Vaccine >=6 Months 05/23/2018   . MMR  12/13/1993, 08/25/1997   . Meningococcal Vaccine 11/22/2005   . Tdap 11/22/2005, 07/04/2017   . Tuberculin PPD 02/28/2005, 04/03/2007, 01/11/2009     No Known Allergies    Objective:  Vitals:    02/27/19 1615   BP: 116/52   BP Location: Left arm   BP Patient Position: Sitting   BP cuff size: Regular   Pulse: 89   Resp: 16   Temp: 97.1 F (36.2 C)   TempSrc: Oral   SpO2: 98%   Weight: 103.6 kg (228 lb 6.4 oz)   Height: '5\' 6"'$  (1.676 m)     Estimated body mass index is 36.86 kg/m as calculated from the following:    Height as of this encounter: '5\' 6"'$  (1.676 m).    Weight as of this encounter: 103.6 kg (228 lb 6.4 oz).        General Appearance: healthy, alert, no distress, pleasant affect, cooperative.  Eyes: Scattered 2-95m skin tags on eyelids  Ears:  Slight effusion B/L TMs, otherwise clear  Pelvic: normal external genitalia and vagina, +white vaginal discharge. Parous cervix, IUD strings visualized        IUD Removal  With the patient in the dorsal lithotomy position, a speculum was placed into vagina.  The strings were easily grasped with: Ring Forceps   The Mirena IUD was removed and shown to the patient.   Patient tolerated procedure well.          Assessment/Plan: Belinda Watson is a 28 year old female with the following issues addressed:    1. Encounter for IUD removal  Mirena IUD removal today, Pt counseled that she may become pregnant immediately after removal so recommend use of OCP until she is ready to start trying to conceive again.     2. Encounter for contraceptive management, unspecified type  Advised use of condoms for the first 7d of starting OCP.   - norgestimate-ethinyl estradiol (ORTHO-CYCLEN) 0.25-35 MG-MCG tablet; Take 1 tablet by mouth daily.  Dispense: 84 tablet; Refill: 3    3. Vaginal discharge  - Vaginosis Screen BD Affirm Collection System    4. Skin tag  Recommend monitoring eyelid skin tags - if remain bothersome, can consult with derm in [place]    5. Earache  Suspect r/t some  residual inflammation with wisdom tooth infection/extraction, probably compounded by allergies. Recommend 3d of Sudafed (which can help with decreasing breast milk) and then switch to antihistamine.          Patient Instructions   Can try Sudafed for 3 days at a time to help with ear pain/congestion and can also help decrease your breastmilk supply. Can then switch to Zyrtec for allergies thereafter     Use condoms for the first week of being on the birth control pill.      Barriers to Learning assessed: none. Patient verbalizes understanding of teaching and instructions.    Return if symptoms worsen or fail to improve.    Kenney Houseman, MD

## 2019-02-28 ENCOUNTER — Encounter (INDEPENDENT_AMBULATORY_CARE_PROVIDER_SITE_OTHER): Payer: Self-pay | Admitting: Family Medicine

## 2019-04-23 ENCOUNTER — Encounter (INDEPENDENT_AMBULATORY_CARE_PROVIDER_SITE_OTHER): Payer: Self-pay | Admitting: Hospital

## 2019-06-10 ENCOUNTER — Encounter (INDEPENDENT_AMBULATORY_CARE_PROVIDER_SITE_OTHER): Payer: Self-pay | Admitting: Hospital

## 2019-08-16 ENCOUNTER — Encounter: Payer: Self-pay | Admitting: Hospital

## 2019-08-21 ENCOUNTER — Encounter: Payer: Self-pay | Admitting: Hospital

## 2019-11-05 ENCOUNTER — Encounter (INDEPENDENT_AMBULATORY_CARE_PROVIDER_SITE_OTHER): Payer: Self-pay

## 2020-06-17 IMAGING — MR MRI KNEE RT WO CONTRAST
4 of 5 series · 28 of 40 positions shown · IV contrast (gadolinium)
Comparison: None

HISTORY: Right knee pain
TECHNIQUE: Multiplanar and multisequence MR imaging of the right knee was performed without the administration of intravenous gadolinium.

[Series 5: t2_axial_fs · axial · right · 3.0mm · 0.42mm/px · z∈[-32,+72]mm · 8 of 28 slices shown]
[im 1/28]
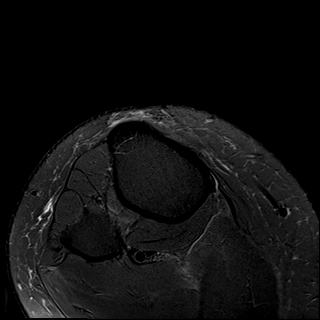
[im 4/28]
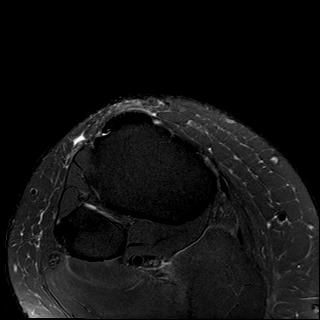
[im 8/28]
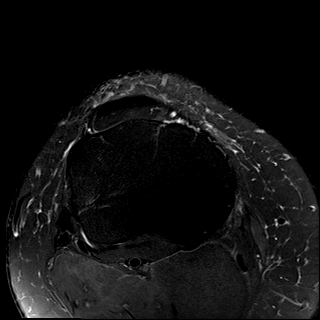
[im 12/28]
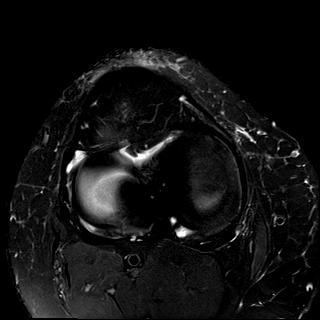
[im 16/28]
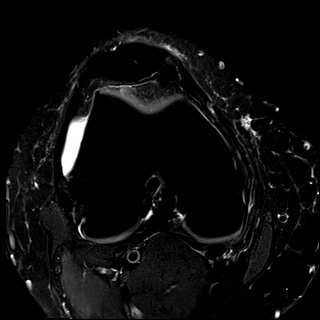
[im 20/28]
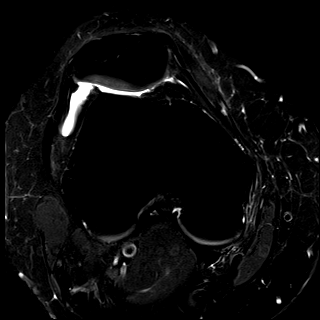
[im 24/28]
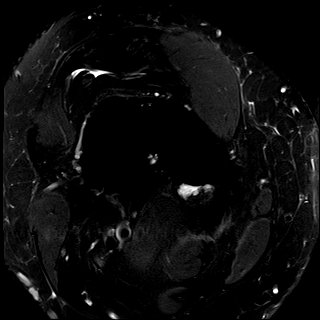
[im 28/28]
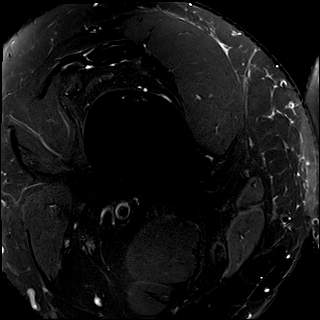

[Series 7: t1_cor · coronal · right · 3.5mm · 0.35mm/px · 7 of 25 slices shown]
[im 1/25]
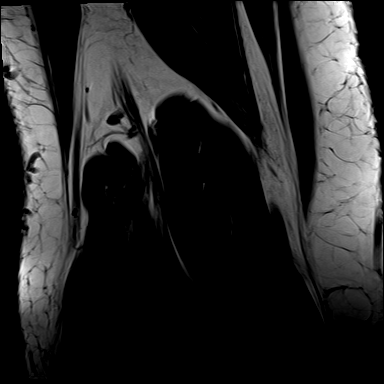
[im 5/25]
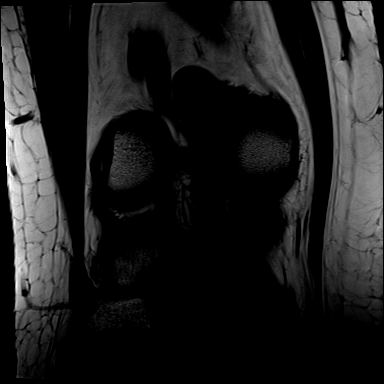
[im 9/25]
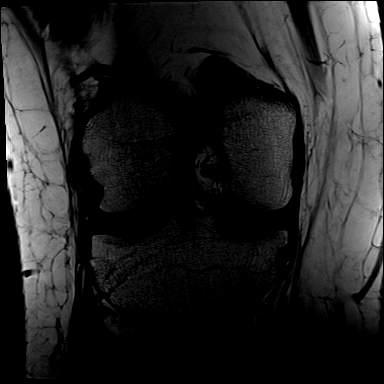
[im 13/25]
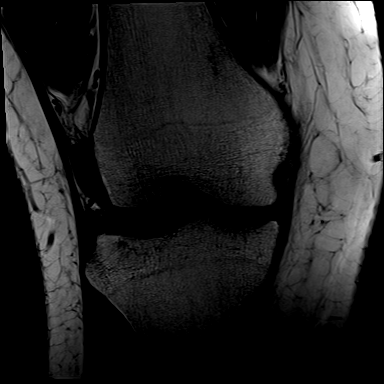
[im 17/25]
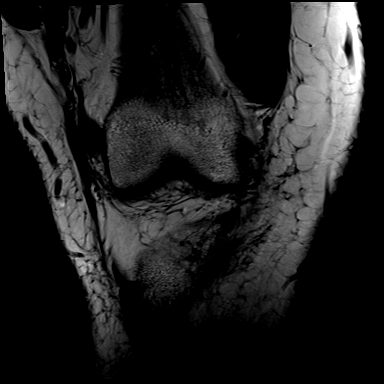
[im 21/25]
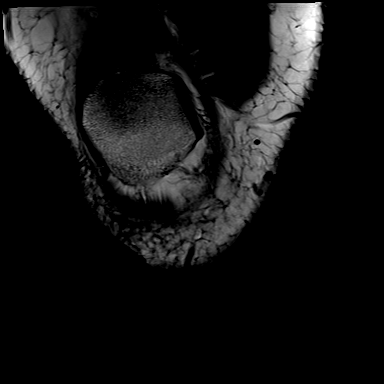
[im 25/25]
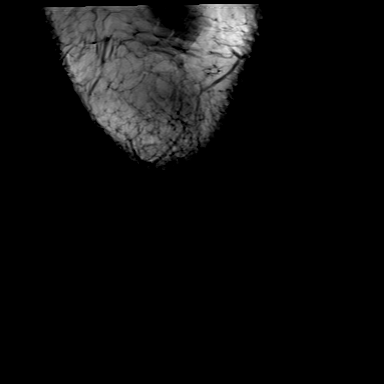

[Series 8: t2_cor_fs · coronal · right · 3.5mm · 0.42mm/px · 7 of 25 slices shown]
[im 1/25]
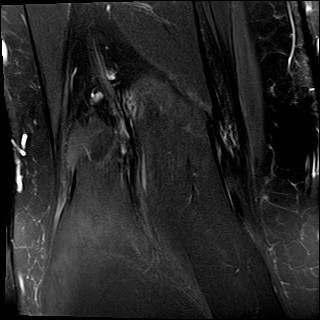
[im 5/25]
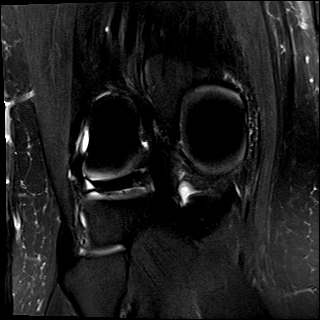
[im 9/25]
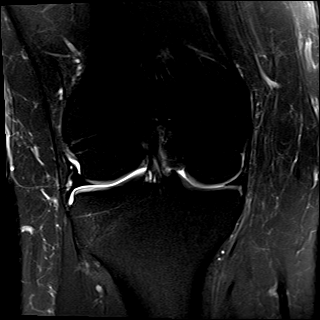
[im 13/25]
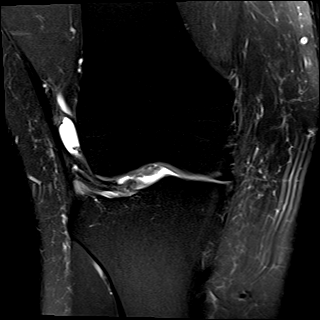
[im 17/25]
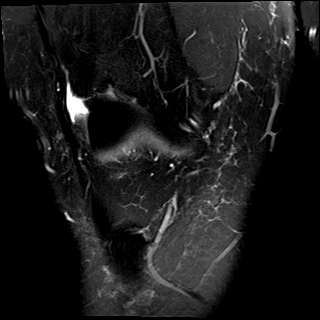
[im 21/25]
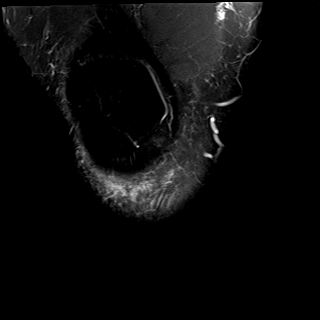
[im 25/25]
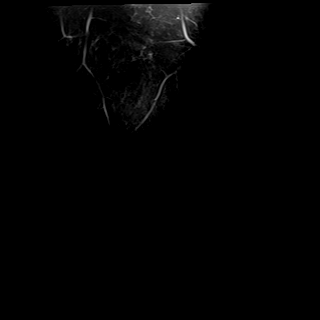

[Series 100: pd_sag_fs · sagittal · right · 3.0mm · 0.21mm/px · 6 of 29 slices shown]
[im 1/29]
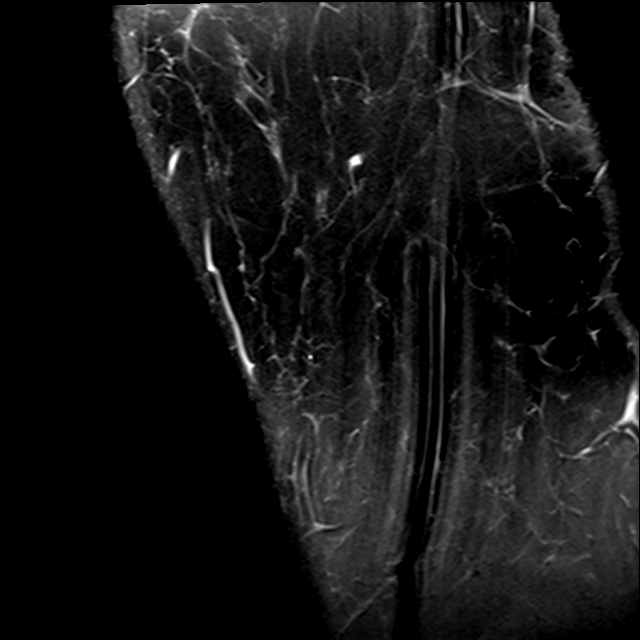
[im 4/29]
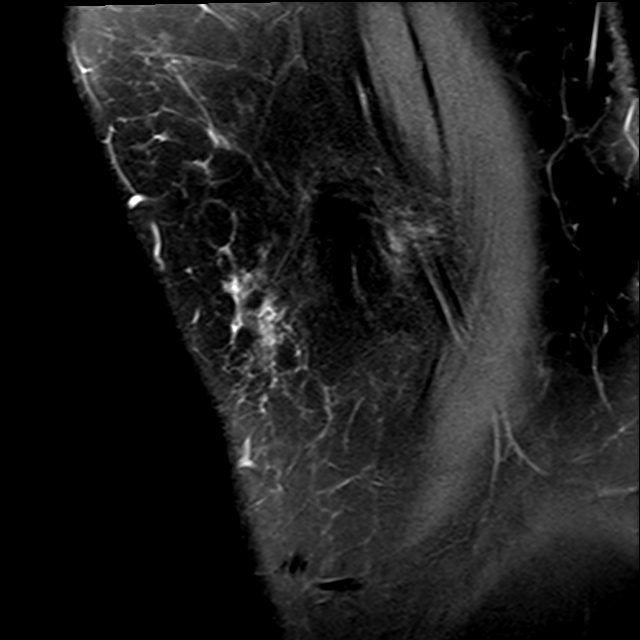
[im 8/29]
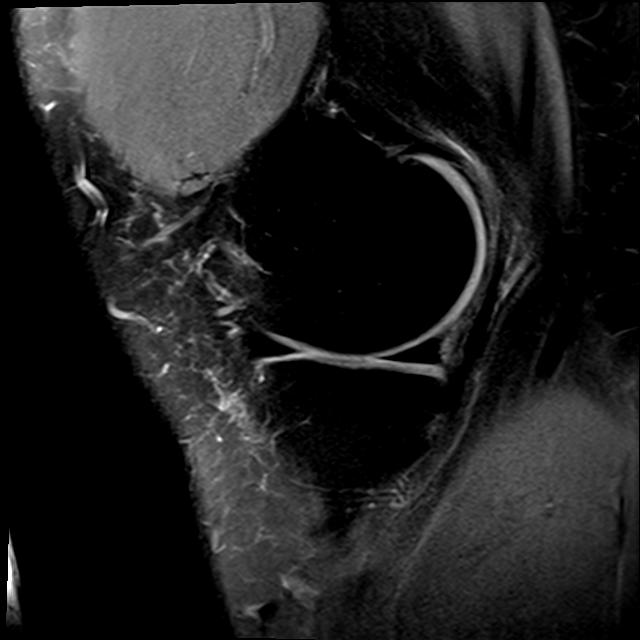
[im 11/29]
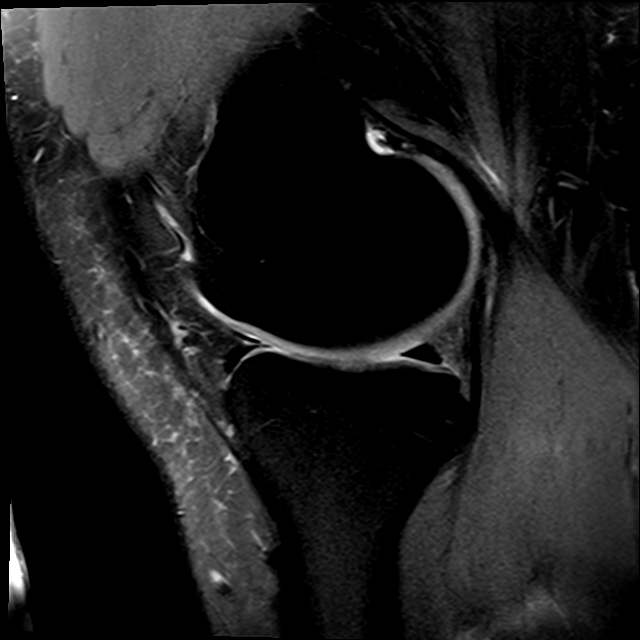
[im 15/29]
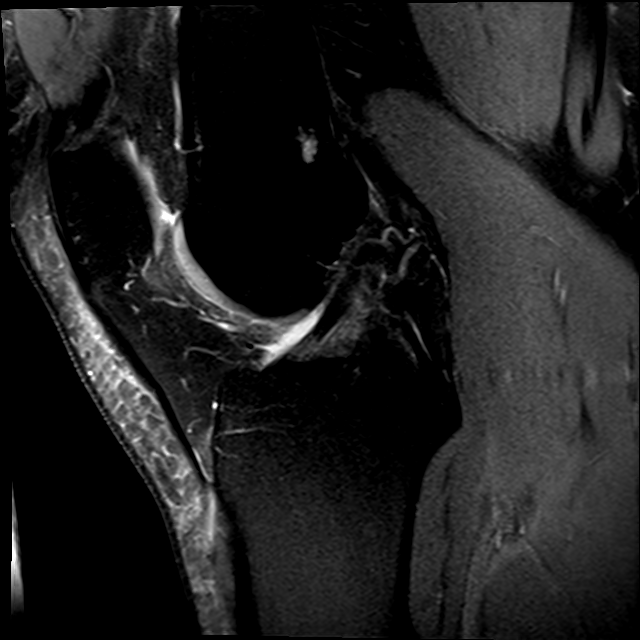
[im 25/29]
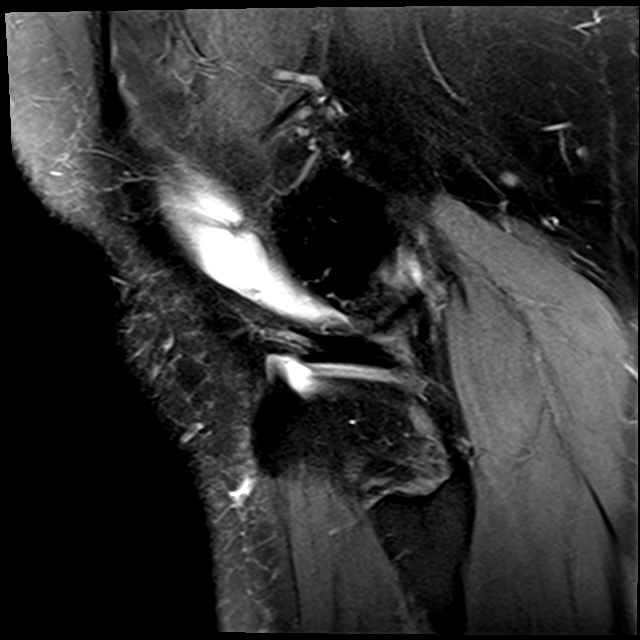

[28 of 40 positions shown; findings below may reference images not displayed]

FINDINGS: The anterior cruciate, posterior cruciate, medial collateral and lateral collateral ligaments are intact.

Both the medial and lateral menisci are intact.

Articular surfaces are normally maintained without significant degeneration.

Osseous structures maintain normal signal intensity.

Tiny effusion.

Extensor mechanism is intact. Patellar retinacula are unremarkable. Signal from muscle normal. Muscle mass normal. No cystic or solid lesions. No visible adenopathy.

Intra-articular fat pads are normal.
IMPRESSION: Tiny effusion which may be posttraumatic or post exertional in nature. Otherwise unremarkable MRI of the right knee.

## 2020-06-17 IMAGING — MR MRI KNEE LT WO CONTRAST
4 of 5 series · 28 of 40 positions shown · IV contrast (gadolinium)
Comparison: None.

HISTORY: Pain in popliteal area.
TECHNIQUE: Multiplanar and multisequence MR imaging of the left knee was performed without the administration of intravenous gadolinium.

[Series 5: t2_axial_fs · axial · left · 3.0mm · 0.42mm/px · z∈[-71,+35]mm · 8 of 28 slices shown]
[im 1/28]
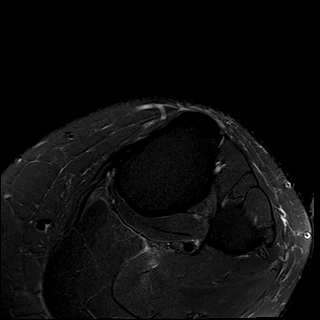
[im 4/28]
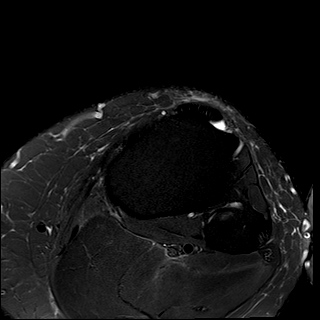
[im 8/28]
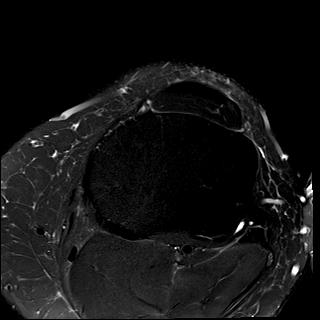
[im 12/28]
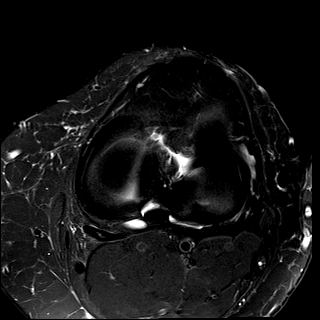
[im 16/28]
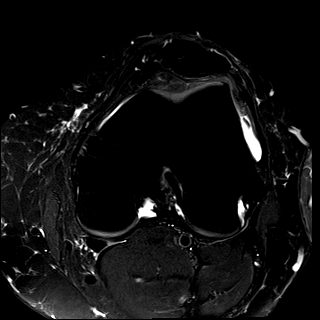
[im 20/28]
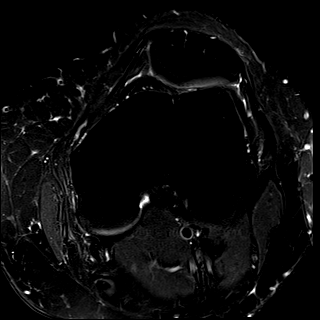
[im 24/28]
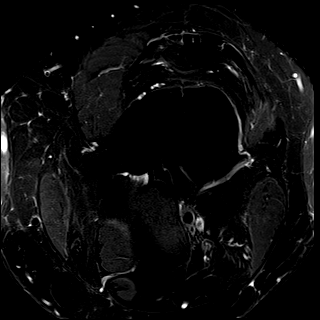
[im 28/28]
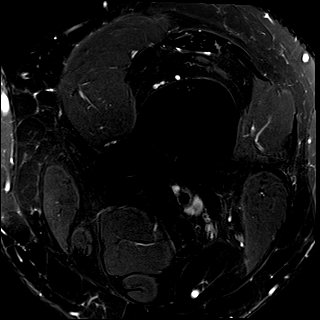

[Series 7: t1_cor · coronal · left · 3.5mm · 0.35mm/px · 7 of 25 slices shown]
[im 1/25]
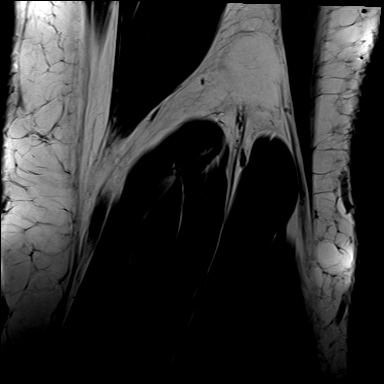
[im 5/25]
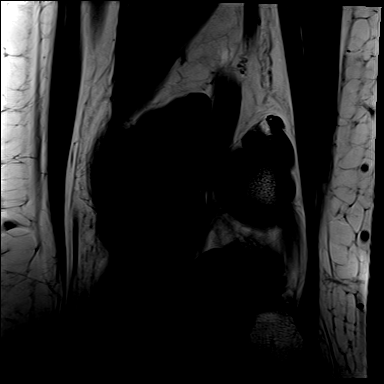
[im 9/25]
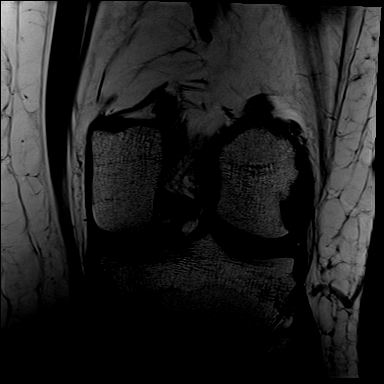
[im 13/25]
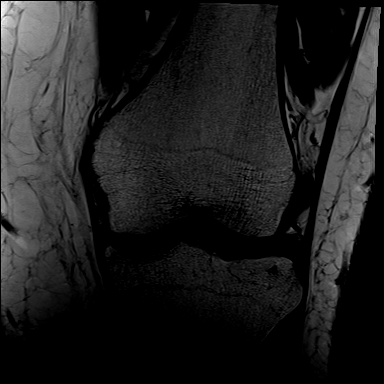
[im 17/25]
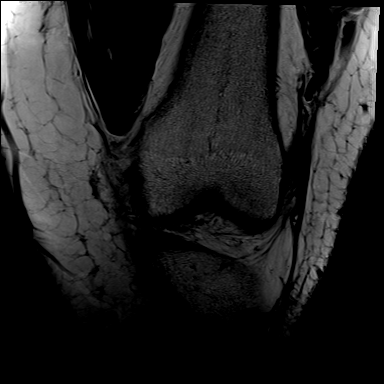
[im 21/25]
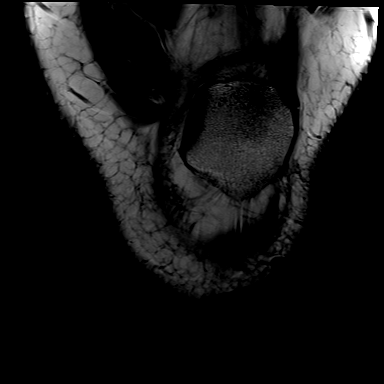
[im 25/25]
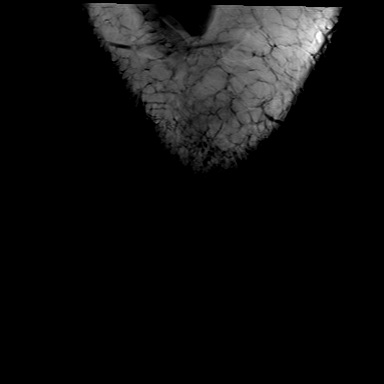

[Series 8: t2_cor_fs · coronal · left · 3.5mm · 0.42mm/px · 7 of 25 slices shown]
[im 1/25]
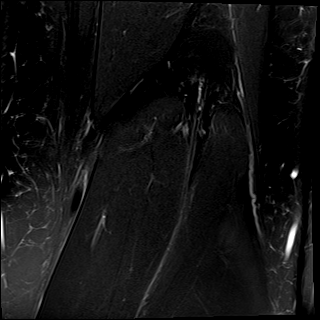
[im 5/25]
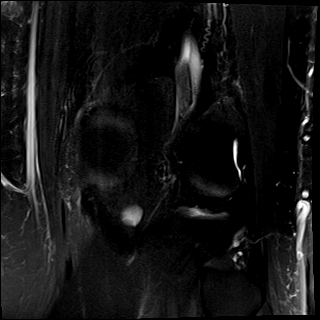
[im 9/25]
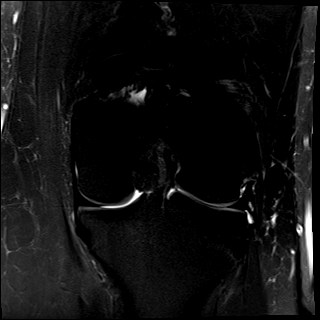
[im 13/25]
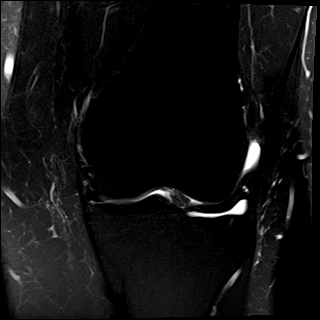
[im 17/25]
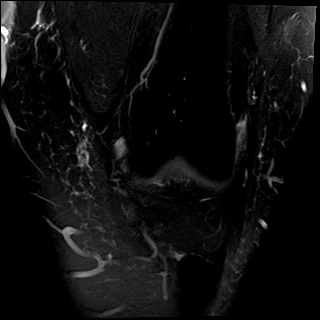
[im 21/25]
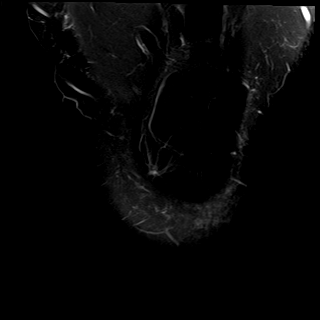
[im 25/25]
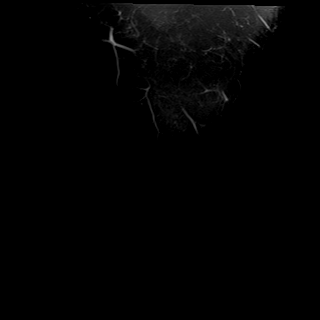

[Series 100: pd_sag_fs · sagittal · left · 3.0mm · 0.21mm/px · 6 of 29 slices shown]
[im 1/29]
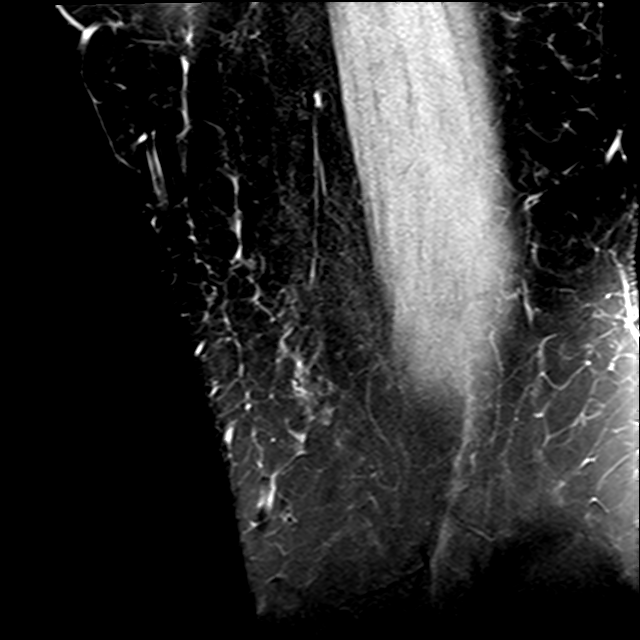
[im 4/29]
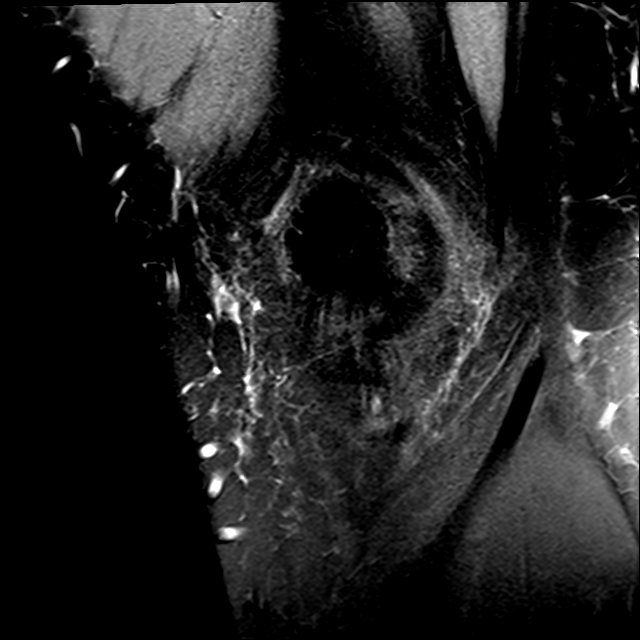
[im 8/29]
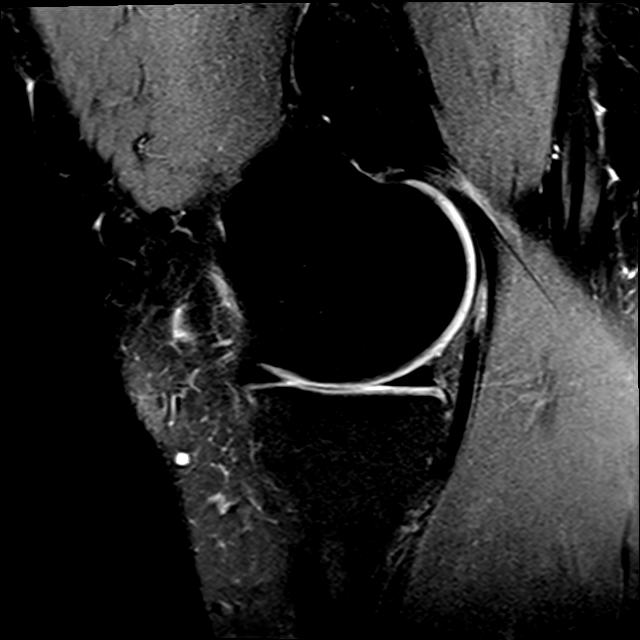
[im 11/29]
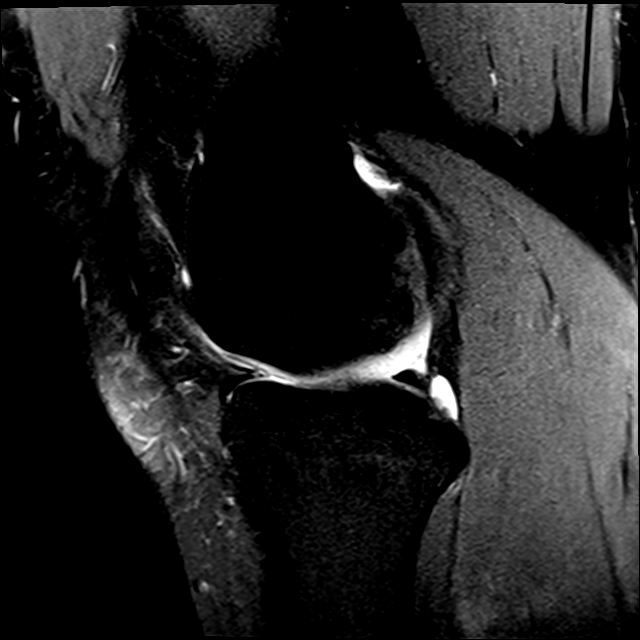
[im 15/29]
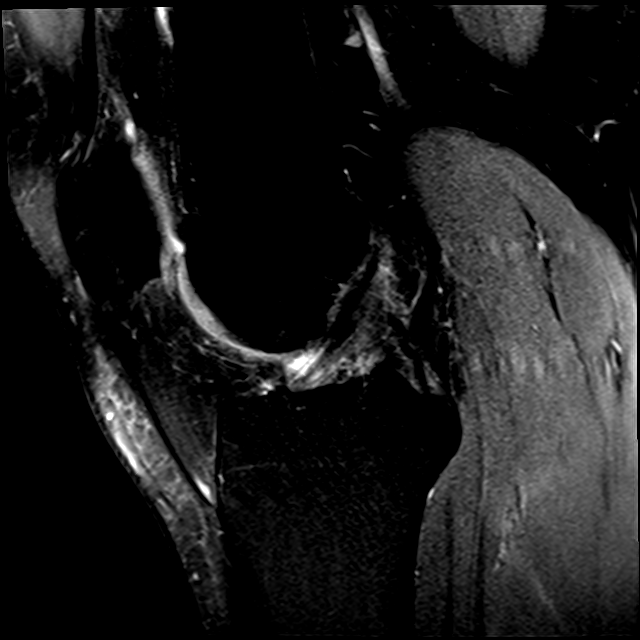
[im 25/29]
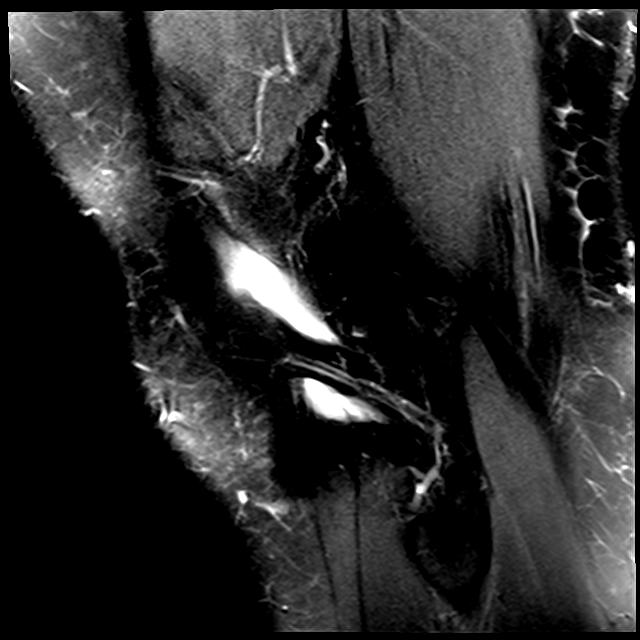

[28 of 40 positions shown; findings below may reference images not displayed]

FINDINGS: The anterior cruciate, posterior cruciate, medial collateral, and lateral collateral ligaments are intact. Both the medial and lateral menisci are intact.

Articular surfaces are normally maintained without significant degeneration

Osseous structures maintain normal signal.

No effusion.

Extensor mechanism is intact. Signal from muscle normal. Muscle mass normal. No cystic or solid masses. No adenopathy.

Intra-articular fat pads are normal.
IMPRESSION: Normal MRI left knee.

## 2020-12-20 ENCOUNTER — Telehealth (INDEPENDENT_AMBULATORY_CARE_PROVIDER_SITE_OTHER): Payer: Self-pay

## 2020-12-20 NOTE — Telephone Encounter (Signed)
Pt called and stated that she is going to be in [facility] for about a month and needs to receive ob while she is here. Pt said she spoke to [employer] and that they told her they would approve her to be seen. I checked [employer] site and did not see an [place]. Pt will call [employer].    Per pt - EDD 9/26 and not a surrogate.

## 2020-12-31 ENCOUNTER — Telehealth (INDEPENDENT_AMBULATORY_CARE_PROVIDER_SITE_OTHER): Payer: Self-pay

## 2021-01-04 ENCOUNTER — Encounter (INDEPENDENT_AMBULATORY_CARE_PROVIDER_SITE_OTHER): Admitting: Registered Nurse

## 2021-01-04 ENCOUNTER — Telehealth (INDEPENDENT_AMBULATORY_CARE_PROVIDER_SITE_OTHER): Payer: Self-pay

## 2021-01-04 NOTE — Telephone Encounter (Signed)
 LMTCB    Please inform pt we are not their primary care provider listed under their insurance. This means their visit will not be covered. To use their insurance they will need to call the phone # on the insurance card and request that the primary care provider be changed to [place] and specify for it to be effective for the appointment date. They can list Letta Median or any other provider at [facility] as the primary care provider (aka PCP). Pt also has the option to do self pay but make sure the are aware of the cost.

## 2021-01-05 ENCOUNTER — Encounter (INDEPENDENT_AMBULATORY_CARE_PROVIDER_SITE_OTHER): Admitting: Registered Nurse

## 2021-01-05 ENCOUNTER — Encounter (INDEPENDENT_AMBULATORY_CARE_PROVIDER_SITE_OTHER): Payer: Self-pay | Admitting: Family

## 2021-01-05 ENCOUNTER — Ambulatory Visit (INDEPENDENT_AMBULATORY_CARE_PROVIDER_SITE_OTHER): Admitting: Family

## 2021-01-05 VITALS — BP 123/80 | HR 105 | Temp 97.6°F | Resp 18 | Wt 240.0 lb

## 2021-01-05 MED ORDER — PRE-NATAL PO: ORAL | Status: AC

## 2021-01-05 NOTE — Progress Notes (Signed)
 [facility] Outpatient Note  HPI   Belinda Watson is a 30 year old female with history of G2P1 who presents to clinic for referral to OB/GYN for 23 weeks pregnancy.    Denies abdominal pain, pelvic pain, back pain, or vaginal bleeding/discharge. Denies HA, dizziness, lightheadedness, chest pain, palpitations, or shortness of breath.    Patient with no other complaints.    Of note patient states "my heart rate has been fast ever since my first pregnancy."    REVIEW OF SYSTEMS    Negative except for those in HPI    PAST MEDICAL AND SURGICAL HISTORY:    has no past surgical history on file.  No past medical history on file.     CURRENT MEDICATIONS:    Current Outpatient Medications:   .  Prenatal Multivit-Min-Fe-FA (PRE-NATAL PO), , Disp: , Rfl:      ALLERGIES:  Patient has no known allergies.    FAMILY HISTORY:  family history includes Diabetes in her maternal grandmother and mother; No Known Problems in her daughter; Stroke in her maternal grandmother.    SOCIAL HISTORY:     Social History     Socioeconomic History   . Marital status: Married   Tobacco Use   . Smoking status: Never Smoker   . Smokeless tobacco: Never Used   Substance and Sexual Activity   . Alcohol use: Not Currently   . Drug use: Not Currently   Social History Narrative    Works at a recreational facilities. Married.      PHYSICAL EXAMINATION       01/05/21  0857   BP: 123/80   Pulse: 105   Resp: 18   Temp: 97.6 F (36.4 C)   SpO2: 98%     Body mass index is 38.74 kg/m.  Wt Readings from Last 5 Encounters:   01/05/21 108.9 kg (240 lb)   02/27/19 103.6 kg (228 lb 6.4 oz)   09/10/18 98.2 kg (216 lb 6.4 oz)   06/28/18 96.6 kg (213 lb)   05/23/18 98.3 kg (216 lb 12.8 oz)     Blood Pressure   01/05/21 123/80   02/27/19 116/52   09/10/18 113/68   06/28/18 124/79   05/23/18 105/72     General Appearance: Patient is well nourished, well developed and in no acute distress.  Eyes: Pupils equal, reactive to light, normal conjunctiva and lids  ENT:  Ears, nose, lips, teeth and gums are normal to inspection. Tongue, tonsils/pharynx normal to inspection. External canals, tympanic membrane normal to inspection. Hearing is normal.  Neck: Supple.  Respiratory: Bilateral lung sounds clear to auscultation. Even and unlabored respiratory rate. No wheezes, no rales, no ronchi.  Cardiovascular: Normal heart sounds. S1S2 auscultated, no murmurs, no rubs, no thrills or gallops. No extremity edema.  Gastrointestinal: Normal bowel sounds present in all quadrants.   Musculoskeletal: Normal inspection of gait.  Neurologic: No focal neurological deficits.  Psychiatric: Alert and oriented to name , place, and situation. Calm and cooperative. Good judgment and insight. Memory, mood, and affect normal.     ASSESSMENT AND PLAN    1. [redacted] weeks gestation of pregnancy  - [facility]    2. Tachycardia  - Drink plenty of fluids to keep yourself hydrated.    Patient Instructions   Follow-up with OB/GYN for pregnancy management.    Continue to take your daily prenatal medication.    Go to the nearest emergency department immediately if:  - chest pain,  palpitations, shortness of breath  - abdominal/pelvic/back pain or vaginal bleeding.  - Your pain or symptoms get worse  - You have new symptoms or concerns.      FOLLOW UP   Return in about 6 months (around 07/07/2021), or if symptoms worsen or fail to improve.    Kerby Less, NP     More than 20 mins of visit was spent counseling patient on medical conditions, physical examination, and documentation.

## 2021-01-07 NOTE — Patient Instructions (Signed)
 Follow-up with OB/GYN for pregnancy management.    Continue to take your daily prenatal medication.    Go to the nearest emergency department immediately if:  - chest pain, palpitations, shortness of breath  - abdominal/pelvic/back pain or vaginal bleeding.  - Your pain or symptoms get worse  - You have new symptoms or concerns.

## 2021-01-10 ENCOUNTER — Telehealth (INDEPENDENT_AMBULATORY_CARE_PROVIDER_SITE_OTHER): Payer: Self-pay | Admitting: Family

## 2021-01-10 NOTE — Addendum Note (Signed)
 Addended by: Kerby Less on: 01/10/2021 02:20 PM     Modules accepted: Orders

## 2021-01-10 NOTE — Telephone Encounter (Signed)
 [facility] Provider Message    Has there been a previous Telephone/MyChart encounter in the last month for same issue?: No    Caller informed that with few exceptions a visit is preferred and encouraged to schedule a visit: N/A    Who's calling?: Pt, Malachi Bonds.    The call is regarding:     Pt calling in regards to [facility] women's health referral, was informed by there insurance ([employer]) that no referral was placed  I informed pt that provider did place referral but we are waiting for it to be processed  Note on referral says that provider needs to send completed notes, pt would like referral placed as STAT since they are overdue for an OB/Gyn visit    Which provider is this relevant to?: Kerby Less NP .  Is this provider in-office today and still seeing patients?: Yes    Ok with a response from a Engineer, site?: Yes    Caller informed that message should be addressed by a provider within 72 business hours?: Yes    If caller is requesting a more urgent response, ROUTE HIGH PRIORITY and inform patient that [facility] will make every effort to address within 24 business hours.    Please ask the caller how they would like to be contacted?: Phone Call. Best call back number: [phone]    No future appointments.    Otila Back

## 2021-01-10 NOTE — Telephone Encounter (Signed)
 Lvmtcb  If patient calls back please inform patient that her ob/gyn referral has been changed to STAT, just waiting for ins to auth.

## 2023-03-01 IMAGING — US US RUQ
1 series · 14 of 25 positions shown · non-contrast
Comparison: None

Images Obtained from [facility]
INDICATION: 32 years-old Female with Abnormal results of liver function studies.
TECHNIQUE: Using real-time and Doppler ultrasound, the right upper quadrant of the abdomen was evaluated.

[Series 1: us ruq · 14 of 77 slices shown]
[im 1/77]
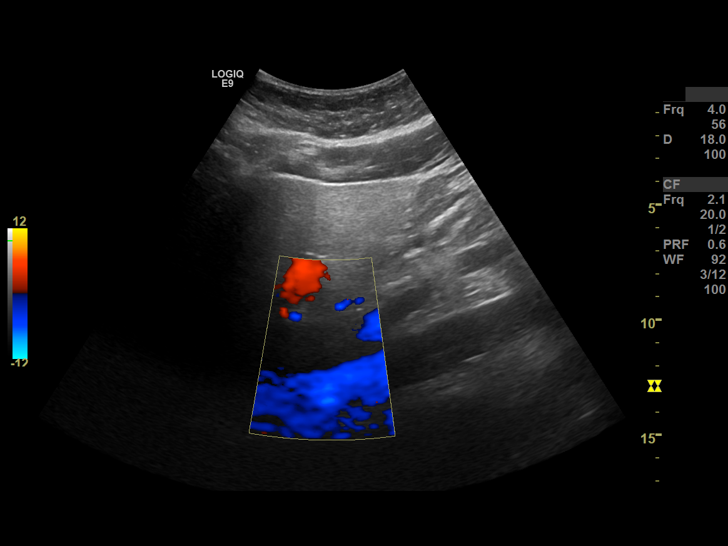
[im 7/77]
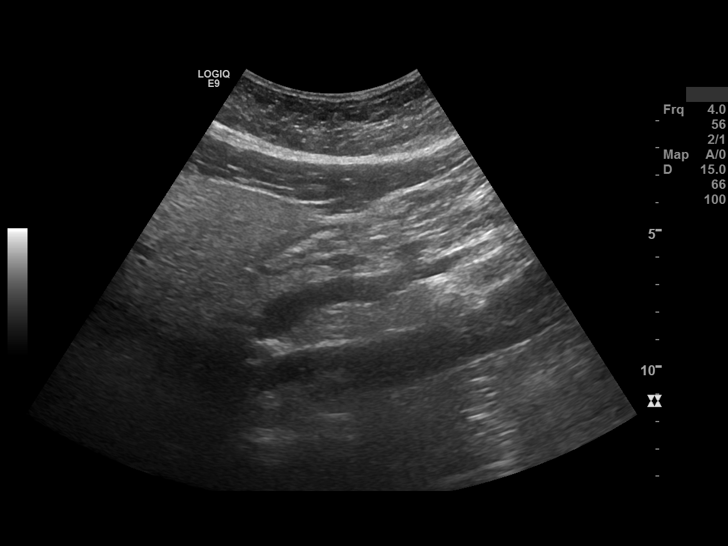
[im 13/77]
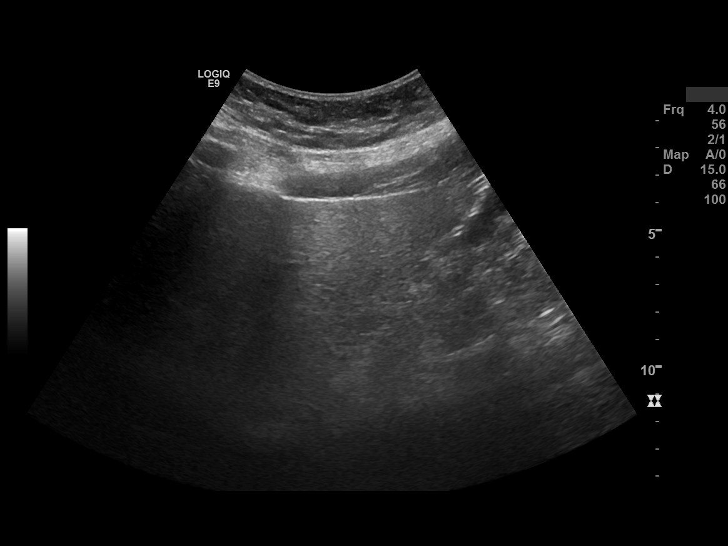
[im 20/77]
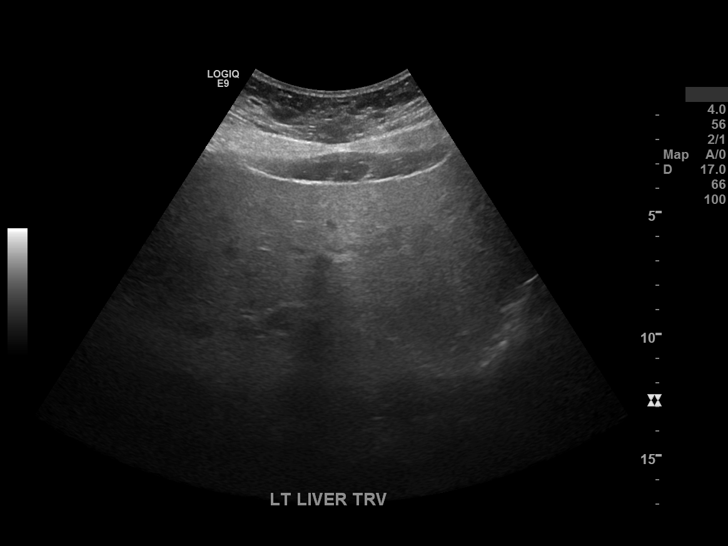
[im 26/77]
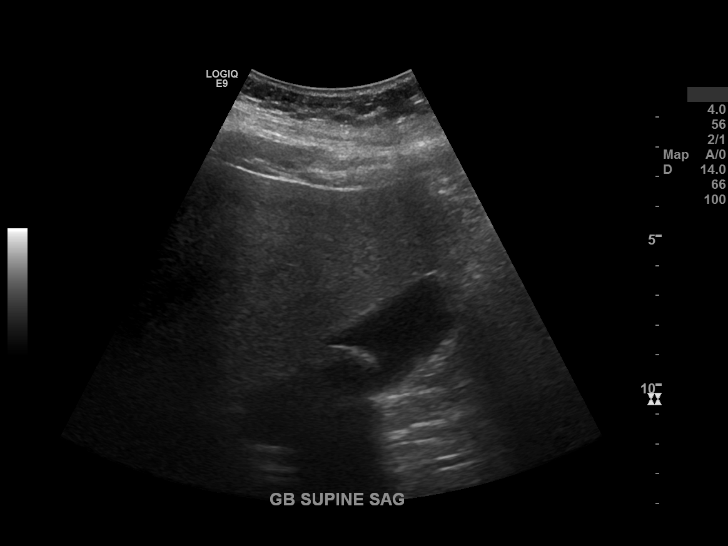
[im 29/77]
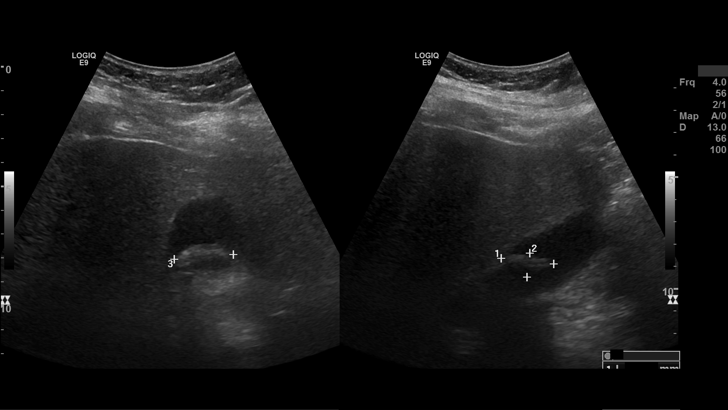
[im 35/77]
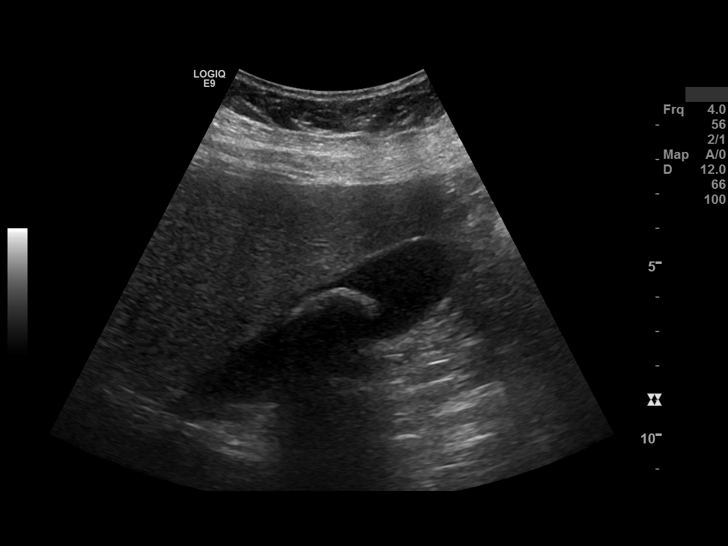
[im 42/77]
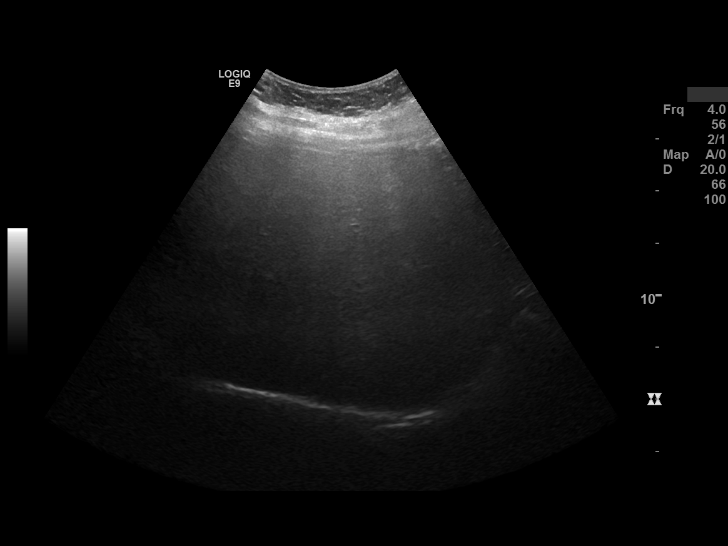
[im 48/77]
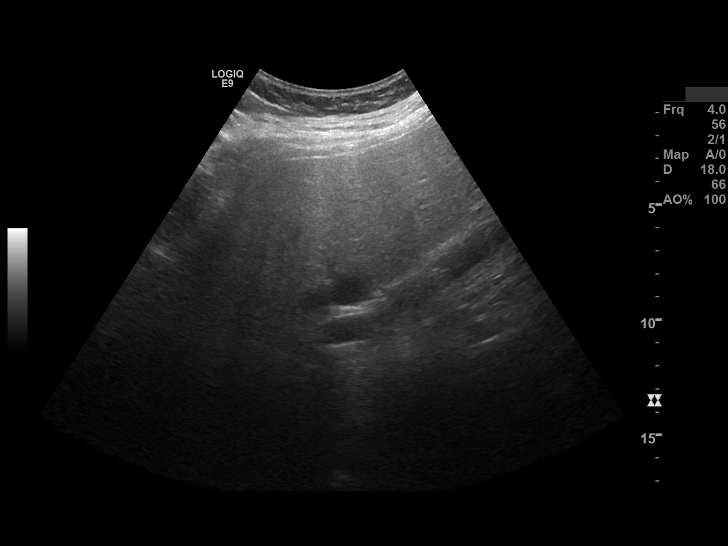
[im 51/77]
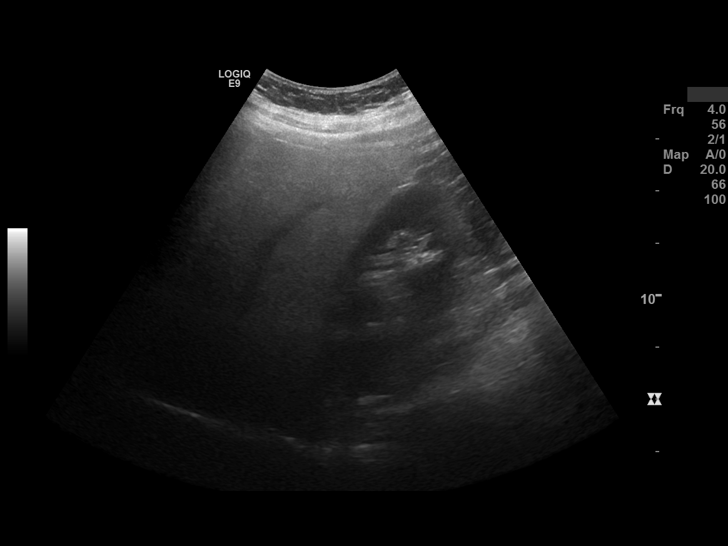
[im 58/77]
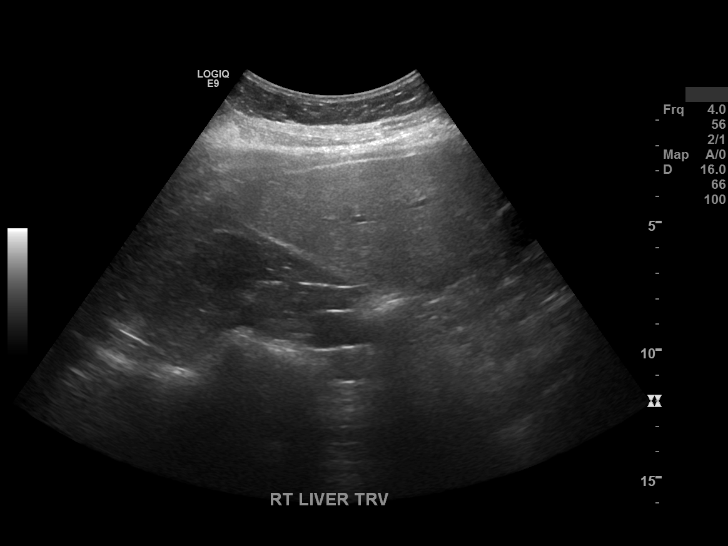
[im 64/77]
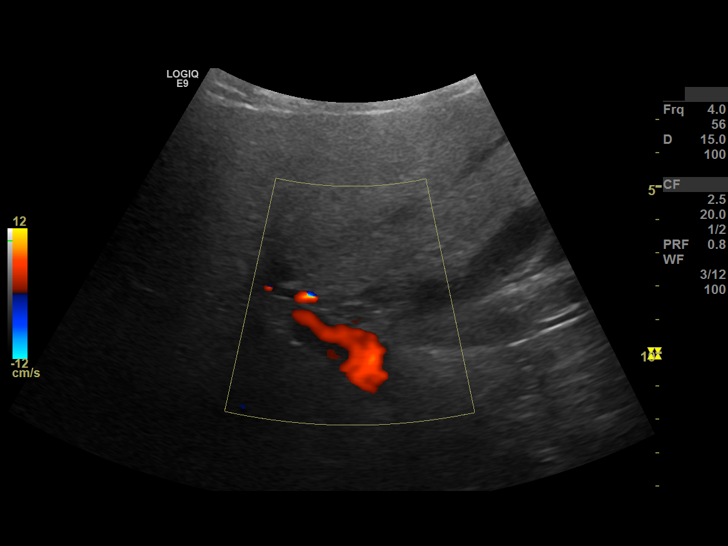
[im 70/77]
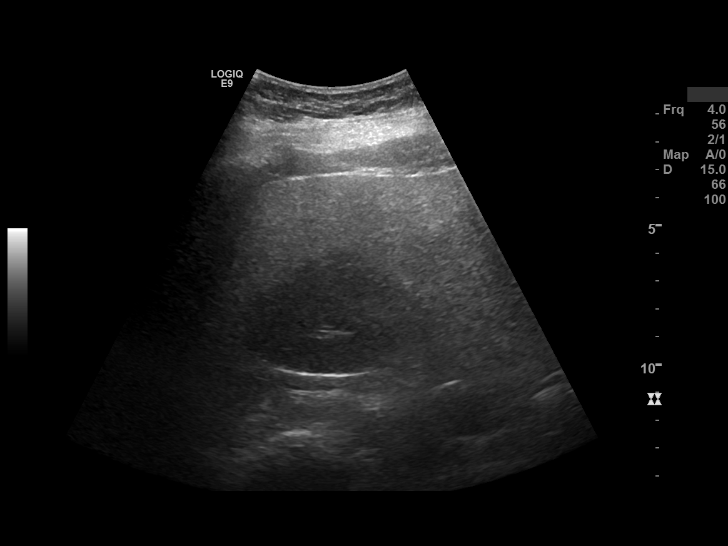
[im 77/77]
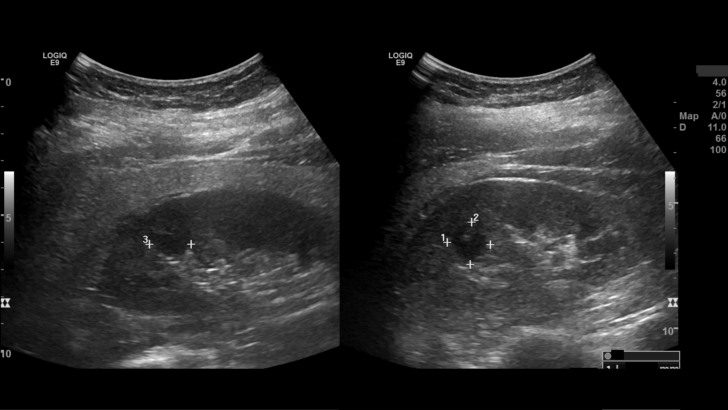

[14 of 25 positions shown; findings below may reference images not displayed]

FINDINGS: The visualized portion of the pancreas is unremarkable. The visualized portion of the abdominal aorta and inferior vena cava is unremarkable.
Liver: Length of  168 mm in the right midclavicular line. There is abnormal size with diffuse increased echotexture which may be due to fatty liver infiltration versus hepatocellular disease.
Hepatopetal flow is identified in the portal vein. No focal masses identified. No intrahepatic biliary ductal dilatation is seen.
Gallbladder: The gallbladder is normally distended with multiple echogenic foci in the right one measuring 24 x 11 x 25 mm in keeping with gallstones. The gallbladder wall thickness measures 3.4 mm.
There is a  negative sonographic Murphy's sign. The common bile duct measures 3.95  mm.
Right kidney measures 129 x 62 x 63 mm. There is a mid to upper renal cyst that measure approximately 17 x 17 x 15 mm.
IMPRESSION: 1.  Abnormal liver in size with fatty liver infiltration versus hepatocellular disease.
2.  Gallstones.
3.  Right mid to upper renal cyst measuring 17 x 17 x 15 mm.

## 2023-11-18 ENCOUNTER — Encounter (INDEPENDENT_AMBULATORY_CARE_PROVIDER_SITE_OTHER): Payer: Self-pay | Admitting: Hospital
